# Patient Record
Sex: Male | Born: 1955 | Race: White | Hispanic: No | Marital: Married | Smoking: Never smoker
Health system: Southern US, Community
[De-identification: ages and names within clinical notes are randomized; demographics above are authoritative.]

## PROBLEM LIST (undated history)

## (undated) DIAGNOSIS — E291 Testicular hypofunction: Secondary | ICD-10-CM

## (undated) DIAGNOSIS — I639 Cerebral infarction, unspecified: Secondary | ICD-10-CM

## (undated) DIAGNOSIS — M545 Low back pain, unspecified: Secondary | ICD-10-CM

## (undated) DIAGNOSIS — E1165 Type 2 diabetes mellitus with hyperglycemia: Secondary | ICD-10-CM

## (undated) DIAGNOSIS — I10 Essential (primary) hypertension: Secondary | ICD-10-CM

## (undated) DIAGNOSIS — N529 Male erectile dysfunction, unspecified: Secondary | ICD-10-CM

## (undated) DIAGNOSIS — I4891 Unspecified atrial fibrillation: Secondary | ICD-10-CM

## (undated) DIAGNOSIS — E782 Mixed hyperlipidemia: Secondary | ICD-10-CM

## (undated) DIAGNOSIS — Z8719 Personal history of other diseases of the digestive system: Secondary | ICD-10-CM

## (undated) DIAGNOSIS — R001 Bradycardia, unspecified: Secondary | ICD-10-CM

## (undated) DIAGNOSIS — I459 Conduction disorder, unspecified: Secondary | ICD-10-CM

## (undated) DIAGNOSIS — I441 Atrioventricular block, second degree: Secondary | ICD-10-CM

## (undated) HISTORY — DX: Male erectile dysfunction, unspecified: N52.9

## (undated) HISTORY — PX: BACK SURGERY: SHX140

## (undated) HISTORY — DX: Atrioventricular block, second degree: I44.1

## (undated) HISTORY — PX: OTHER SURGICAL HISTORY: SHX169

## (undated) HISTORY — DX: Personal history of other diseases of the digestive system: Z87.19

## (undated) HISTORY — DX: Cerebral infarction, unspecified: I63.9

## (undated) HISTORY — DX: Type 2 diabetes mellitus with hyperglycemia: E11.65

## (undated) HISTORY — DX: Essential (primary) hypertension: I10

## (undated) HISTORY — DX: Unspecified atrial fibrillation: I48.91

## (undated) HISTORY — DX: Low back pain, unspecified: M54.50

## (undated) HISTORY — DX: Testicular hypofunction: E29.1

## (undated) HISTORY — DX: Mixed hyperlipidemia: E78.2

## (undated) HISTORY — DX: Bradycardia, unspecified: R00.1

---

## 2017-04-09 DIAGNOSIS — I10 Essential (primary) hypertension: Secondary | ICD-10-CM | POA: Insufficient documentation

## 2017-06-25 DIAGNOSIS — E1165 Type 2 diabetes mellitus with hyperglycemia: Secondary | ICD-10-CM | POA: Insufficient documentation

## 2017-06-25 DIAGNOSIS — M545 Low back pain, unspecified: Secondary | ICD-10-CM | POA: Insufficient documentation

## 2017-06-25 DIAGNOSIS — E291 Testicular hypofunction: Secondary | ICD-10-CM | POA: Insufficient documentation

## 2017-06-25 DIAGNOSIS — N529 Male erectile dysfunction, unspecified: Secondary | ICD-10-CM | POA: Insufficient documentation

## 2017-06-25 DIAGNOSIS — E782 Mixed hyperlipidemia: Secondary | ICD-10-CM | POA: Insufficient documentation

## 2017-06-25 DIAGNOSIS — Z8719 Personal history of other diseases of the digestive system: Secondary | ICD-10-CM | POA: Insufficient documentation

## 2018-07-02 DIAGNOSIS — R001 Bradycardia, unspecified: Secondary | ICD-10-CM | POA: Insufficient documentation

## 2018-07-02 DIAGNOSIS — I441 Atrioventricular block, second degree: Secondary | ICD-10-CM | POA: Insufficient documentation

## 2018-07-02 DIAGNOSIS — Z0000 Encounter for general adult medical examination without abnormal findings: Secondary | ICD-10-CM | POA: Insufficient documentation

## 2020-04-17 DIAGNOSIS — I10 Essential (primary) hypertension: Secondary | ICD-10-CM

## 2020-04-22 DIAGNOSIS — I480 Paroxysmal atrial fibrillation: Secondary | ICD-10-CM | POA: Insufficient documentation

## 2020-04-22 DIAGNOSIS — I639 Cerebral infarction, unspecified: Secondary | ICD-10-CM | POA: Insufficient documentation

## 2020-04-22 DIAGNOSIS — Z09 Encounter for follow-up examination after completed treatment for conditions other than malignant neoplasm: Secondary | ICD-10-CM | POA: Insufficient documentation

## 2020-05-24 ENCOUNTER — Other Ambulatory Visit: Payer: Self-pay

## 2020-05-26 ENCOUNTER — Observation Stay (HOSPITAL_COMMUNITY)
Admission: EM | Admit: 2020-05-26 | Discharge: 2020-05-27 | Disposition: A | Discharge status: Home / Self Care | Payer: No Typology Code available for payment source | Attending: Internal Medicine | Admitting: Internal Medicine

## 2020-05-26 ENCOUNTER — Emergency Department (HOSPITAL_COMMUNITY): Payer: No Typology Code available for payment source

## 2020-05-26 ENCOUNTER — Other Ambulatory Visit: Payer: Self-pay

## 2020-05-26 ENCOUNTER — Encounter: Payer: Self-pay | Admitting: Cardiology

## 2020-05-26 ENCOUNTER — Encounter (HOSPITAL_COMMUNITY): Payer: Self-pay

## 2020-05-26 ENCOUNTER — Ambulatory Visit (HOSPITAL_COMMUNITY)
Admission: EM | Disposition: A | Discharge status: Home / Self Care | Payer: Self-pay | Source: Home / Self Care | Attending: Emergency Medicine

## 2020-05-26 ENCOUNTER — Ambulatory Visit (INDEPENDENT_AMBULATORY_CARE_PROVIDER_SITE_OTHER): Payer: PRIVATE HEALTH INSURANCE | Admitting: Cardiology

## 2020-05-26 VITALS — BP 140/60 | HR 52 | Ht 69.0 in | Wt 166.0 lb

## 2020-05-26 DIAGNOSIS — Z09 Encounter for follow-up examination after completed treatment for conditions other than malignant neoplasm: Secondary | ICD-10-CM | POA: Diagnosis not present

## 2020-05-26 DIAGNOSIS — Z79899 Other long term (current) drug therapy: Secondary | ICD-10-CM | POA: Insufficient documentation

## 2020-05-26 DIAGNOSIS — Z7901 Long term (current) use of anticoagulants: Secondary | ICD-10-CM | POA: Diagnosis not present

## 2020-05-26 DIAGNOSIS — I10 Essential (primary) hypertension: Secondary | ICD-10-CM | POA: Diagnosis not present

## 2020-05-26 DIAGNOSIS — E119 Type 2 diabetes mellitus without complications: Secondary | ICD-10-CM | POA: Insufficient documentation

## 2020-05-26 DIAGNOSIS — I6930 Unspecified sequelae of cerebral infarction: Secondary | ICD-10-CM | POA: Insufficient documentation

## 2020-05-26 DIAGNOSIS — I480 Paroxysmal atrial fibrillation: Secondary | ICD-10-CM | POA: Diagnosis not present

## 2020-05-26 DIAGNOSIS — I442 Atrioventricular block, complete: Secondary | ICD-10-CM

## 2020-05-26 DIAGNOSIS — Z20822 Contact with and (suspected) exposure to covid-19: Secondary | ICD-10-CM | POA: Insufficient documentation

## 2020-05-26 DIAGNOSIS — Z950 Presence of cardiac pacemaker: Secondary | ICD-10-CM

## 2020-05-26 DIAGNOSIS — Z7984 Long term (current) use of oral hypoglycemic drugs: Secondary | ICD-10-CM | POA: Diagnosis not present

## 2020-05-26 DIAGNOSIS — Z959 Presence of cardiac and vascular implant and graft, unspecified: Secondary | ICD-10-CM

## 2020-05-26 DIAGNOSIS — R001 Bradycardia, unspecified: Secondary | ICD-10-CM | POA: Diagnosis present

## 2020-05-26 HISTORY — DX: Presence of cardiac pacemaker: Z95.0

## 2020-05-26 HISTORY — DX: Conduction disorder, unspecified: I45.9

## 2020-05-26 HISTORY — PX: PACEMAKER IMPLANT: EP1218

## 2020-05-26 LAB — CBC WITH DIFFERENTIAL/PLATELET
Abs Immature Granulocytes: 0.01 10*3/uL (ref 0.00–0.07)
Basophils Absolute: 0 10*3/uL (ref 0.0–0.1)
Basophils Relative: 0 %
Eosinophils Absolute: 0.1 10*3/uL (ref 0.0–0.5)
Eosinophils Relative: 1 %
HCT: 53.1 % — ABNORMAL HIGH (ref 39.0–52.0)
Hemoglobin: 17.1 g/dL — ABNORMAL HIGH (ref 13.0–17.0)
Immature Granulocytes: 0 %
Lymphocytes Relative: 15 %
Lymphs Abs: 1 10*3/uL (ref 0.7–4.0)
MCH: 30.2 pg (ref 26.0–34.0)
MCHC: 32.2 g/dL (ref 30.0–36.0)
MCV: 93.8 fL (ref 80.0–100.0)
Monocytes Absolute: 0.3 10*3/uL (ref 0.1–1.0)
Monocytes Relative: 5 %
Neutro Abs: 4.9 10*3/uL (ref 1.7–7.7)
Neutrophils Relative %: 79 %
Platelets: 148 10*3/uL — ABNORMAL LOW (ref 150–400)
RBC: 5.66 MIL/uL (ref 4.22–5.81)
RDW: 11.9 % (ref 11.5–15.5)
WBC: 6.3 10*3/uL (ref 4.0–10.5)
nRBC: 0 % (ref 0.0–0.2)

## 2020-05-26 LAB — BASIC METABOLIC PANEL
Anion gap: 11 (ref 5–15)
BUN: 41 mg/dL — ABNORMAL HIGH (ref 8–23)
CO2: 19 mmol/L — ABNORMAL LOW (ref 22–32)
Calcium: 9.5 mg/dL (ref 8.9–10.3)
Chloride: 108 mmol/L (ref 98–111)
Creatinine, Ser: 1.93 mg/dL — ABNORMAL HIGH (ref 0.61–1.24)
GFR calc non Af Amer: 36 mL/min — ABNORMAL LOW (ref >60)
Glucose, Bld: 223 mg/dL — ABNORMAL HIGH (ref 70–99)
Potassium: 4.5 mmol/L (ref 3.5–5.1)
Sodium: 138 mmol/L (ref 135–145)

## 2020-05-26 LAB — RESPIRATORY PANEL BY RT PCR (FLU A&B, COVID)
Influenza A by PCR: NEGATIVE
Influenza B by PCR: NEGATIVE
SARS Coronavirus 2 by RT PCR: NEGATIVE

## 2020-05-26 LAB — MAGNESIUM: Magnesium: 2.3 mg/dL (ref 1.7–2.4)

## 2020-05-26 LAB — GLUCOSE, CAPILLARY
Glucose-Capillary: 192 mg/dL — ABNORMAL HIGH (ref 70–99)
Glucose-Capillary: 181 mg/dL — ABNORMAL HIGH (ref 70–99)

## 2020-05-26 LAB — TSH: TSH: 1.791 u[IU]/mL (ref 0.350–4.500)

## 2020-05-26 SURGERY — PACEMAKER IMPLANT

## 2020-05-26 MED ORDER — SODIUM CHLORIDE 0.9 % IV SOLN: 250.0000 mL | INTRAVENOUS | Status: DC | PRN

## 2020-05-26 MED ORDER — HEPARIN (PORCINE) IN NACL 1000-0.9 UT/500ML-% IV SOLN
INTRAVENOUS | Status: AC
  Filled 2020-05-26: qty 500

## 2020-05-26 MED ORDER — LIDOCAINE HCL (PF) 1 % IJ SOLN
INTRAMUSCULAR | Status: DC | PRN
  Administered 2020-05-26: 60 mL

## 2020-05-26 MED ORDER — LINAGLIPTIN 5 MG PO TABS
5.0000 mg | ORAL_TABLET | Freq: Every day | ORAL | Status: DC
  Administered 2020-05-27: 5 mg via ORAL
  Filled 2020-05-26: qty 1

## 2020-05-26 MED ORDER — SODIUM CHLORIDE 0.9 % IV SOLN: INTRAVENOUS | Status: DC

## 2020-05-26 MED ORDER — LISINOPRIL-HYDROCHLOROTHIAZIDE 20-12.5 MG PO TABS: 1.0000 | ORAL_TABLET | Freq: Every day | ORAL | Status: DC

## 2020-05-26 MED ORDER — ACETAMINOPHEN 325 MG PO TABS: 325.0000 mg | ORAL_TABLET | ORAL | Status: DC | PRN

## 2020-05-26 MED ORDER — IOHEXOL 350 MG/ML SOLN
INTRAVENOUS | Status: DC | PRN
  Administered 2020-05-26: 15 mL

## 2020-05-26 MED ORDER — HYDROXYZINE HCL 25 MG PO TABS: 25.0000 mg | ORAL_TABLET | Freq: Three times a day (TID) | ORAL | Status: DC | PRN

## 2020-05-26 MED ORDER — HYDROCHLOROTHIAZIDE 12.5 MG PO CAPS
12.5000 mg | ORAL_CAPSULE | Freq: Every day | ORAL | Status: DC
  Administered 2020-05-27: 12.5 mg via ORAL
  Filled 2020-05-26: qty 1

## 2020-05-26 MED ORDER — SODIUM CHLORIDE 0.9% FLUSH
3.0000 mL | Freq: Two times a day (BID) | INTRAVENOUS | Status: DC
  Administered 2020-05-26 – 2020-05-27 (×2): 3 mL via INTRAVENOUS

## 2020-05-26 MED ORDER — CEFAZOLIN SODIUM-DEXTROSE 2-4 GM/100ML-% IV SOLN
INTRAVENOUS | Status: AC
  Filled 2020-05-26: qty 100

## 2020-05-26 MED ORDER — AMLODIPINE BESYLATE 10 MG PO TABS
10.0000 mg | ORAL_TABLET | Freq: Every day | ORAL | Status: DC
  Administered 2020-05-27: 10 mg via ORAL
  Filled 2020-05-26: qty 1

## 2020-05-26 MED ORDER — LISINOPRIL 20 MG PO TABS
20.0000 mg | ORAL_TABLET | Freq: Every day | ORAL | Status: DC
  Filled 2020-05-26: qty 1

## 2020-05-26 MED ORDER — HYDRALAZINE HCL 10 MG PO TABS
10.0000 mg | ORAL_TABLET | Freq: Three times a day (TID) | ORAL | Status: DC
  Administered 2020-05-26 – 2020-05-27 (×2): 10 mg via ORAL
  Filled 2020-05-26 (×2): qty 1

## 2020-05-26 MED ORDER — CHLORHEXIDINE GLUCONATE 4 % EX LIQD
60.0000 mL | Freq: Once | CUTANEOUS | Status: DC
  Filled 2020-05-26: qty 60

## 2020-05-26 MED ORDER — HYDROCODONE-ACETAMINOPHEN 5-325 MG PO TABS
1.0000 | ORAL_TABLET | ORAL | Status: DC | PRN
  Administered 2020-05-26 – 2020-05-27 (×2): 1 via ORAL
  Filled 2020-05-26 (×2): qty 1

## 2020-05-26 MED ORDER — SODIUM CHLORIDE 0.9 % IV SOLN
INTRAVENOUS | Status: AC
  Filled 2020-05-26: qty 2

## 2020-05-26 MED ORDER — CEFAZOLIN SODIUM-DEXTROSE 2-4 GM/100ML-% IV SOLN
2.0000 g | INTRAVENOUS | Status: AC
  Administered 2020-05-26: 2 g via INTRAVENOUS

## 2020-05-26 MED ORDER — ONDANSETRON HCL 4 MG/2ML IJ SOLN: 4.0000 mg | Freq: Four times a day (QID) | INTRAMUSCULAR | Status: DC | PRN

## 2020-05-26 MED ORDER — SODIUM CHLORIDE 0.9% FLUSH: 3.0000 mL | INTRAVENOUS | Status: DC | PRN

## 2020-05-26 MED ORDER — HEPARIN (PORCINE) IN NACL 1000-0.9 UT/500ML-% IV SOLN
INTRAVENOUS | Status: DC | PRN
  Administered 2020-05-26: 500 mL

## 2020-05-26 MED ORDER — LIDOCAINE HCL 1 % IJ SOLN
INTRAMUSCULAR | Status: AC
  Filled 2020-05-26: qty 60

## 2020-05-26 MED ORDER — EMPAGLIFLOZIN 10 MG PO TABS
10.0000 mg | ORAL_TABLET | Freq: Every day | ORAL | Status: DC
  Administered 2020-05-27: 10 mg via ORAL
  Filled 2020-05-26: qty 1

## 2020-05-26 MED ORDER — HYPROMELLOSE (GONIOSCOPIC) 2.5 % OP SOLN
1.0000 [drp] | OPHTHALMIC | Status: DC | PRN
  Filled 2020-05-26: qty 15

## 2020-05-26 MED ORDER — ATORVASTATIN CALCIUM 40 MG PO TABS
40.0000 mg | ORAL_TABLET | Freq: Every day | ORAL | Status: DC
  Administered 2020-05-27: 40 mg via ORAL
  Filled 2020-05-26: qty 1

## 2020-05-26 MED ORDER — CEFAZOLIN SODIUM-DEXTROSE 1-4 GM/50ML-% IV SOLN
1.0000 g | Freq: Three times a day (TID) | INTRAVENOUS | Status: DC
  Administered 2020-05-26 – 2020-05-27 (×2): 1 g via INTRAVENOUS
  Filled 2020-05-26 (×3): qty 50

## 2020-05-26 MED ORDER — SODIUM CHLORIDE 0.9 % IV SOLN
80.0000 mg | INTRAVENOUS | Status: AC
  Administered 2020-05-26: 80 mg
  Filled 2020-05-26: qty 2

## 2020-05-26 SURGICAL SUPPLY — 12 items

## 2020-05-26 NOTE — ED Triage Notes (Signed)
Pt arrived via [facility] from [facility] for complete heart block per cardiologist. Pt is A&Ox4. Pt has 3rd degree block on monitor.

## 2020-05-26 NOTE — H&P (Addendum)
ELECTROPHYSIOLOGY CONSULT NOTE    Patient ID: Gabriel Velazquez MRN: [id number], DOB/AGE: 1955/11/14 65 y.o.  Admit date: 05/26/2020 Date of Consult: 05/26/2020  Primary Physician: Hadley Pen, MD Primary Cardiologist: No primary care provider on file.  Electrophysiologist: New  Reason for Admission: CHB  Patient Profile: Gabriel Velazquez is a 64 y.o. male with a history of paroxysmal atrial fibrillation, HTN, DM2, ETOH use, and recent CVA who is being seen today for the evaluation of CHB at the request of Dr. Lowella Curb (Sent via EMS from [place]).  HPI:  Gabriel Velazquez is a 64 y.o. male with medical history of above.   He was noted to have new AF when seen for his stroke last month. Has been on Eliquis. He presented to Dr. Vanetta Shawl office today and was found to be in CHB with junctional escape in the 50s, though asymptomatic.  Pt denied chest pain, syncope, lightheadedness or dizziness. No peripheral edema.  Pt sent to [facility] via EMS for PPM consideration.  He is feeling Ok currently. He report he had his stroke on 04/24/2020 and that he has no residual deficit. Atrial fibrillation was newly diagnosed that admission. He reports previous history of heart catheterizations for non specific chest pain that were negative. He denies syncope, orthopnea, edema, N/V/D, or recent illness. He does have mild DOE over the past several weeks.   COVID negative. K WNL. Cr 1.9  Past Medical History:  Diagnosis Date  . Atrial fibrillation (HCC)   . Bradycardia   . Cerebrovascular accident (CVA) (HCC)   . Combined hyperlipidemia   . History of diverticulosis   . Hypogonadism in male   . Impotence of organic origin   . Low back pain   . Mixed dyslipidemia   . Mobitz type 1 second degree atrioventricular block   . Type 2 diabetes mellitus with hyperglycemia, without long-term current use of insulin (HCC)   . Uncontrolled hypertension      Surgical History:  Past Surgical History:  Procedure  Laterality Date  . BACK SURGERY    . colonscopy with polypectomy       (Not in a hospital admission)   Inpatient Medications:   Allergies: No Known Allergies  Social History   Socioeconomic History  . Marital status: Married    Spouse name: Not on file  . Number of children: Not on file  . Years of education: Not on file  . Highest education level: Not on file  Occupational History  . Not on file  Tobacco Use  . Smoking status: Never Smoker  . Smokeless tobacco: Never Used  Substance and Sexual Activity  . Alcohol use: Never  . Drug use: Never  . Sexual activity: Not on file  Other Topics Concern  . Not on file  Social History Narrative  . Not on file   Social Determinants of Health   Financial Resource Strain:   . Difficulty of Paying Living Expenses: Not on file  Food Insecurity:   . Worried About Programme researcher, broadcasting/film/video in the Last Year: Not on file  . Ran Out of Food in the Last Year: Not on file  Transportation Needs:   . Lack of Transportation (Medical): Not on file  . Lack of Transportation (Non-Medical): Not on file  Physical Activity:   . Days of Exercise per Week: Not on file  . Minutes of Exercise per Session: Not on file  Stress:   . Feeling of Stress : Not on file  Social  Connections:   . Frequency of Communication with Friends and Family: Not on file  . Frequency of Social Gatherings with Friends and Family: Not on file  . Attends Religious Services: Not on file  . Active Member of Clubs or Organizations: Not on file  . Attends Banker Meetings: Not on file  . Marital Status: Not on file  Intimate Partner Violence:   . Fear of Current or Ex-Partner: Not on file  . Emotionally Abused: Not on file  . Physically Abused: Not on file  . Sexually Abused: Not on file     Family History  Problem Relation Age of Onset  . Throat cancer Mother   . Heart disease Father   . Heart disease Brother      Review of Systems: All other systems  reviewed and are otherwise negative except as noted above.  Physical Exam: Vitals:   05/26/20 1211 05/26/20 1230 05/26/20 1245 05/26/20 1300  BP:  (!) 162/64 139/69 (!) 148/62  Pulse:  (!) 40 (!) 38 (!) 40  Resp:  20 (!) 21 (!) 21  Temp:      TempSrc:      SpO2:  100% 100% 100%  Weight: 75.3 kg     Height: 5\' 9"  (1.753 m)       GEN- The patient is well appearing, alert and oriented x 3 today.   HEENT: normocephalic, atraumatic; sclera clear, conjunctiva pink; hearing intact; oropharynx clear; neck supple Lungs- Clear to ausculation bilaterally, normal work of breathing.  No wheezes, rales, rhonchi Heart- Regular rate and rhythm, no murmurs, rubs or gallops GI- soft, non-tender, non-distended, bowel sounds present Extremities- no clubbing, cyanosis, or edema; DP/PT/radial pulses 2+ bilaterally MS- no significant deformity or atrophy Skin- warm and dry, no rash or lesion Psych- euthymic mood, full affect Neuro- strength and sensation are intact  Labs:  No results found for: WBC, HGB, HCT, MCV, PLT No results for input(s): NA, K, CL, CO2, BUN, CREATININE, CALCIUM, PROT, BILITOT, ALKPHOS, ALT, AST, GLUCOSE in the last 168 hours.  Invalid input(s): LABALBU    Radiology/Studies: DG Chest Portable 1 View  Result Date: 05/26/2020 CLINICAL DATA:  Bradycardia EXAM: PORTABLE CHEST 1 VIEW COMPARISON:  April 17, 2020 FINDINGS: Lungs are clear. The heart size is upper normal with pulmonary vascularity within normal limits. No adenopathy. No pneumothorax. No bone lesions. IMPRESSION: Lungs clear.  Heart upper normal in size. Electronically Signed   By: April 19, 2020 III M.D.   On: 05/26/2020 12:59    EKG: CHB with junctional escape at 38 bpm, narrow QRS (personally reviewed)  TELEMETRY: Intermittent CHB and 2:1 AV block (personally reviewed)  Assessment/Plan: 1.  Complete heart Block Pt is not on AV nodal blocking agents.  Electrolytes and TSH WNL. COVID negative.  Recent Echo  during CVA work up showed normal EF per notes from [place].  He has demonstrated CHB without reversible cause. Explained risks, benefits, and alternatives to PPM implantation, including but not limited to bleeding, infection, pneumothorax, pericardial effusion, lead dislodgement, heart attack, stroke, or death.  Pt verbalized understanding and agrees to proceed.   2. CVA 04/2020 Doing well overall. Denies deficit  3. HTN Can add BB s/p PPM.  4. DM2 Continue current medications  5. PAF CHA2DS2VASC of at least 4.   On Eliquis. Will resume as soon as safe from a PPM standpoint in the setting of recent stroke.   6. CKD III Unclear baseline. Cr 1.9 on admission  Dr. 05/2020 has  seen the patient and plans for PPM implantation this afternoon.   For questions or updates, please contact [facility] Please consult [email or web address] for contact info under Cardiology/STEMI.  Signed, Graciella Freer, PA-C  05/26/2020 1:23 PM   I have seen, examined the patient, and reviewed the above assessment and plan.  Changes to above are made where necessary.  On exam, bradycardic rhythm.  He appears to have complete heart block.  He reports progressive SOB and decreased exercise tolerance for several weeks.  The patient has symptomatic bradycardia without reversible cause.  I would therefore recommend pacemaker implantation at this time.  Risks, benefits, alternatives to pacemaker implantation were discussed in detail with the patient today. The patient understands that the risks include but are not limited to bleeding, infection, pneumothorax, perforation, tamponade, vascular damage, renal failure, MI, stroke, death,  and lead dislodgement and wishes to proceed    Co Sign: Hillis Range, MD 05/26/2020 3:18 PM

## 2020-05-26 NOTE — ED Provider Notes (Signed)
[facility] EMERGENCY DEPARTMENT Provider Note   CSN: [id number] Arrival date & time: 05/26/20  1207     History Chief Complaint  Patient presents with  . Bradycardia    Gabriel Velazquez is a 64 y.o. male.  HPI      Gabriel Velazquez is a 64 y.o. male, with a history of A. fib, CVA, DM type II, HTN, dyslipidemia, Mobitz type I, presenting to the ED sent by cardiology office for bradycardia and concern for complete heart block.  He was seen by cardiology, Dr. Bing Matter, this morning as routine follow-up for his CVA hospitalization that occurred about a month ago. Incidentally noted complete heart block on EKG. Patient sent by EMS to our ED.  Patient denies fever/chills, dizziness, chest pain, shortness of breath, N/V/D, syncope, or any other complaints.   Past Medical History:  Diagnosis Date  . Atrial fibrillation (HCC)   . Bradycardia   . Cerebrovascular accident (CVA) (HCC)   . Combined hyperlipidemia   . History of diverticulosis   . Hypogonadism in male   . Impotence of organic origin   . Low back pain   . Mixed dyslipidemia   . Mobitz type 1 second degree atrioventricular block   . Type 2 diabetes mellitus with hyperglycemia, without long-term current use of insulin (HCC)   . Uncontrolled hypertension     Patient Active Problem List   Diagnosis Date Noted  . Heart block AV complete (HCC) 05/26/2020  . 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  . Paroxysmal atrial fibrillation (HCC) 04/22/2020  . Cerebrovascular accident (CVA) (HCC) 04/22/2020  . Hospital discharge follow-up 04/22/2020  . Bradycardia 07/02/2018  . Mobitz type 1 second degree atrioventricular block 07/02/2018  . Annual physical exam 07/02/2018  . Combined hyperlipidemia 06/25/2017  . History of diverticulosis 06/25/2017  . Hypogonadism in male 06/25/2017  . ED (erectile dysfunction) of organic origin 06/25/2017  . Low back pain 06/25/2017  . Mixed dyslipidemia  06/25/2017  . Type 2 diabetes mellitus with hyperglycemia, without long-term current use of insulin (HCC) 06/25/2017  . Essential hypertension 04/09/2017    Past Surgical History:  Procedure Laterality Date  . BACK SURGERY    . colonscopy with polypectomy         Family History  Problem Relation Age of Onset  . Throat cancer Mother   . Heart disease Father   . Heart disease Brother     Social History   Tobacco Use  . Smoking status: Never Smoker  . Smokeless tobacco: Never Used  Substance Use Topics  . Alcohol use: Never  . Drug use: Never    Home Medications Prior to Admission medications   Medication Sig Start Date End Date Taking? Authorizing Provider  amLODipine (NORVASC) 10 MG tablet Take 10 mg by mouth daily. 11/16/19  Yes [provider]  apixaban (ELIQUIS) 5 MG TABS tablet Take 5 mg by mouth 2 (two) times daily. 05/24/20  Yes [provider]  atorvastatin (LIPITOR) 40 MG tablet Take 40 mg by mouth daily.  07/02/18  Yes [provider]  empagliflozin (JARDIANCE) 10 MG TABS tablet Take 10 mg by mouth daily. 05/24/20  Yes [provider]  hydrALAZINE (APRESOLINE) 10 MG tablet Take 10 mg by mouth 3 (three) times daily. 05/11/20  Yes [provider]  hydroxypropyl methylcellulose / hypromellose (ISOPTO TEARS / GONIOVISC) 2.5 % ophthalmic solution Place 1 drop into both eyes as needed for dry eyes.   Yes [provider]  hydrOXYzine (ATARAX/VISTARIL) 25 MG tablet Take 25 mg by mouth every 8 (eight) hours as needed for anxiety. 05/11/20  Yes [provider]  lisinopril-hydrochlorothiazide (ZESTORETIC) 20-12.5 MG tablet Take 1 tablet by mouth daily.   Yes [provider]  sitaGLIPtin (JANUVIA) 100 MG tablet Take 100 mg by mouth daily. 04/22/20  Yes [provider]    Allergies    Patient has no known allergies.  Review of Systems   Review of Systems  Constitutional: Negative for chills,  diaphoresis and fever.  Respiratory: Negative for shortness of breath.   Cardiovascular: Negative for chest pain.       Bradycardia  Gastrointestinal: Negative for abdominal pain, diarrhea, nausea and vomiting.  Neurological: Negative for dizziness, syncope, weakness and light-headedness.  All other systems reviewed and are negative.   Physical Exam Updated Vital Signs BP (!) 179/66   Pulse (!) 43   Temp 97.9 F (36.6 C) (Oral)   Resp 20   Ht 5\' 9"  (1.753 m)   Wt 75.3 kg   SpO2 99%   BMI 24.51 kg/m   Physical Exam Vitals and nursing note reviewed.  Constitutional:      General: He is not in acute distress.    Appearance: He is well-developed. He is not diaphoretic.  HENT:     Head: Normocephalic and atraumatic.     Mouth/Throat:     Mouth: Mucous membranes are moist.     Pharynx: Oropharynx is clear.  Eyes:     Conjunctiva/sclera: Conjunctivae normal.  Cardiovascular:     Rate and Rhythm: Regular rhythm. Bradycardia present.     Pulses: Normal pulses.          Radial pulses are 2+ on the right side and 2+ on the left side.       Posterior tibial pulses are 2+ on the right side and 2+ on the left side.     Heart sounds: Normal heart sounds.     Comments: Tactile temperature in the extremities appropriate and equal bilaterally. Pulmonary:     Effort: Pulmonary effort is normal. No respiratory distress.     Breath sounds: Normal breath sounds.  Abdominal:     Tenderness: There is no guarding.  Musculoskeletal:     Cervical back: Neck supple.     Right lower leg: No edema.     Left lower leg: No edema.  Skin:    General: Skin is warm and dry.  Neurological:     Mental Status: He is alert and oriented to person, place, and time.  Psychiatric:        Mood and Affect: Mood and affect normal.        Speech: Speech normal.        Behavior: Behavior normal.     ED Results / Procedures / Treatments   Labs (all labs ordered are listed, but only abnormal results are  displayed) Labs Reviewed  BASIC METABOLIC PANEL - Abnormal; Notable for the following components:      Result Value   CO2 19 (*)    Glucose, Bld 223 (*)    BUN 41 (*)    Creatinine, Ser 1.93 (*)    GFR calc non Af Amer 36 (*)    All other components within normal limits  CBC WITH DIFFERENTIAL/PLATELET - Abnormal; Notable for the following components:   Hemoglobin 17.1 (*)    HCT 53.1 (*)    Platelets 148 (*)    All other components within normal limits  RESPIRATORY PANEL BY RT PCR (FLU A&B, COVID)  SURGICAL PCR SCREEN  MAGNESIUM  TSH    EKG EKG Interpretation  Date/Time:  Thursday May 26 2020 12:07:48 EDT Ventricular Rate:  38 PR Interval:    QRS Duration: 107 QT Interval:  425 QTC Calculation: 338 R Axis:   12 Text Interpretation: Sinus bradycardia Atrial premature complex LVH with secondary repolarization abnormality Confirmed by Marianna Fuss ([id number]) on 05/26/2020 12:14:13 PM   Radiology DG Chest Portable 1 View  Result Date: 05/26/2020 CLINICAL DATA:  Bradycardia EXAM: PORTABLE CHEST 1 VIEW COMPARISON:  April 17, 2020 FINDINGS: Lungs are clear. The heart size is upper normal with pulmonary vascularity within normal limits. No adenopathy. No pneumothorax. No bone lesions. IMPRESSION: Lungs clear.  Heart upper normal in size. Electronically Signed   By: Bretta Bang III M.D.   On: 05/26/2020 12:59    Procedures .Critical Care Performed by: Anselm Pancoast, PA-C Authorized by: Anselm Pancoast, PA-C   Critical care provider statement:    Critical care time (minutes):  35   Critical care time was exclusive of:  Separately billable procedures and treating other patients   Critical care was necessary to treat or prevent imminent or life-threatening deterioration of the following conditions:  Cardiac failure (Third-degree complete heart block)   Critical care was time spent personally by me on the following activities:  Ordering and performing treatments and  interventions, ordering and review of laboratory studies, ordering and review of radiographic studies, re-evaluation of patient's condition, obtaining history from patient or surrogate, examination of patient, discussions with consultants, development of treatment plan with patient or surrogate, review of old charts and pulse oximetry   I assumed direction of critical care for this patient from another provider in my specialty: no     (including critical care time)  Medications Ordered in ED Medications  0.9 %  sodium chloride infusion (has no administration in time range)  gentamicin (GARAMYCIN) 80 mg in sodium chloride 0.9 % 500 mL irrigation (has no administration in time range)  chlorhexidine (HIBICLENS) 4 % liquid 4 application (has no administration in time range)  chlorhexidine (HIBICLENS) 4 % liquid 4 application (has no administration in time range)  0.9 %  sodium chloride infusion (has no administration in time range)  ceFAZolin (ANCEF) IVPB 2g/100 mL premix (has no administration in time range)    ED Course  I have reviewed the triage vital signs and the nursing notes.  Pertinent labs & imaging results that were available during my care of the patient were reviewed by me and considered in my medical decision making (see chart for details).  Clinical Course as of May 26 1501  Thu May 26, 2020  1321 Spoke with Rosann Auerbach, cardiology. They will send down a team member.    [SJ]    Clinical Course User Index [SJ] Cyle Kenyon, Lorella Nimrod   MDM Rules/Calculators/A&P                          Patient presents from cardiology office with complete (third-degree) heart block EKG from their office.  No hypotension.  Normal mental status. I personally reviewed and interpreted the patient's labs and imaging studies. Patient has elevation in creatinine and BUN without previous values more recent than 2019 with which to compare.  Both normal in 2019. No acute abnormalities noted on chest x-ray,  TSH, magnesium, or other electrolytes. Patient reevaluated in the ED multiple times with  no changes noted.  Patient evaluated by cardiology and permanent pacemaker was recommended.  Plan for cardiology to perform this procedure this afternoon.   Findings and plan of care discussed with Marianna Fuss, MD. Dr. Stevie Kern personally evaluated and examined this patient.   Vitals:   05/26/20 1400 05/26/20 1415 05/26/20 1430 05/26/20 1445  BP: (!) 177/62 (!) 169/67 (!) 164/70 (!) 152/95  Pulse: (!) 38 (!) 42 (!) 41 (!) 40  Resp: (!) 21 19 20 15   Temp:      TempSrc:      SpO2: 100% 100% 100% 100%  Weight:      Height:         Final Clinical Impression(s) / ED Diagnoses Final diagnoses:  Complete heart block [facility])    Rx / DC Orders ED Discharge Orders    None       [facility] 05/26/20 1507    07/26/20, MD 05/28/20 1646

## 2020-05-26 NOTE — Progress Notes (Signed)
Cardiology Consultation:    Date:  05/26/2020   ID:  Gabriel Velazquez, DOB 28-Dec-1955, MRN [id number]  PCP:  Hadley Pen, MD  Cardiologist:  Gypsy Balsam, MD   Referring MD: Hadley Pen, MD   Chief Complaint  Patient presents with  . Hospitalization Follow-up    History of Present Illness:    Gabriel Velazquez is a 64 y.o. male who is being seen today for the evaluation of past hospitalization at the request of Hadley Pen, MD.  Recently about a month ago he ended going to round of hospital.  The reason why he went there was the fact that he got some right facial drop as well as facial numbness.  He also has some difficulty speaking.  His past medical history significant for hypertension diabetes type 2 he used to be a regular alcohol drinker however stopped drinking after he was discharged from hospital.  While in the hospital quite extensive evaluation has been done.  His echocardiogram showed normal left ventricle ejection fraction, however during echocardiogram he was noted to have atrial fibrillation.  He was monitor in telemetry however I do not have any documentation of atrial fibrillation.  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  He came to our office today to be establish as a patient.  He is doing well he denies have any chest pain tightness squeezing pressure burning chest, no syncope no dizziness.  Surprisingly his EKG today showed complete heart block.  There is documentation in the hospital that he was having first-degree type I AV block while in the hospital.  However I do not see EKG confirming that.  However today he clearly does have complete heart block with complete dissociation luckily his escape mechanism is quite stable involve a junctional rhythm.  And again he is asymptomatic.  Past Medical History:  Diagnosis Date  . Atrial  fibrillation (HCC)   . Bradycardia   . Cerebrovascular accident (CVA) (HCC)   . Combined hyperlipidemia   . History of diverticulosis   . Hypogonadism in male   . Impotence of organic origin   . Low back pain   . Mixed dyslipidemia   . Mobitz type 1 second degree atrioventricular block   . Type 2 diabetes mellitus with hyperglycemia, without long-term current use of insulin (HCC)   . Uncontrolled hypertension     Past Surgical History:  Procedure Laterality Date  . BACK SURGERY    . colonscopy with polypectomy      Current Medications: Current Meds  Medication Sig  . amLODipine (NORVASC) 10 MG tablet Take 10 mg by mouth daily.  Marland Kitchen apixaban (ELIQUIS) 5 MG TABS tablet Take 5 mg by mouth 2 (two) times daily.  Marland Kitchen atorvastatin (LIPITOR) 20 MG tablet Take 20 mg by mouth daily.  . empagliflozin (JARDIANCE) 10 MG TABS tablet Take 10 mg by mouth daily.  . hydrALAZINE (APRESOLINE) 10 MG tablet Take 10 mg by mouth 3 (three) times daily.  . hydrOXYzine (ATARAX/VISTARIL) 25 MG tablet Take 25 mg by mouth every 8 (eight) hours as needed for anxiety.  . pantoprazole (PROTONIX) 40 MG tablet Take 40 mg by mouth daily.  . sitaGLIPtin (JANUVIA) 100 MG tablet Take 100 mg by mouth daily.     Allergies:   Patient has no known allergies.   Social History   Socioeconomic History  .  Marital status: Married    Spouse name: Not on file  . Number of children: Not on file  . Years of education: Not on file  . Highest education level: Not on file  Occupational History  . Not on file  Tobacco Use  . Smoking status: Never Smoker  . Smokeless tobacco: Never Used  Substance and Sexual Activity  . Alcohol use: Never  . Drug use: Never  . Sexual activity: Not on file  Other Topics Concern  . Not on file  Social History Narrative  . Not on file   Social Determinants of Health   Financial Resource Strain:   . Difficulty of Paying Living Expenses: Not on file  Food Insecurity:   . Worried About  Programme researcher, broadcasting/film/video in the Last Year: Not on file  . Ran Out of Food in the Last Year: Not on file  Transportation Needs:   . Lack of Transportation (Medical): Not on file  . Lack of Transportation (Non-Medical): Not on file  Physical Activity:   . Days of Exercise per Week: Not on file  . Minutes of Exercise per Session: Not on file  Stress:   . Feeling of Stress : Not on file  Social Connections:   . Frequency of Communication with Friends and Family: Not on file  . Frequency of Social Gatherings with Friends and Family: Not on file  . Attends Religious Services: Not on file  . Active Member of Clubs or Organizations: Not on file  . Attends Banker Meetings: Not on file  . Marital Status: Not on file     Family History: The patient's family history includes Heart disease in his brother and father; Throat cancer in his mother. ROS:   Please see the history of present illness.    All 14 point review of systems negative except as described per history of present illness.  EKGs/Labs/Other Studies Reviewed:    The following studies were reviewed today: Complete heart block with junctional escape rhythm with left ventricle hypertrophy.    Recent Labs: No results found for requested labs within last 8760 hours.  Recent Lipid Panel No results found for: CHOL, TRIG, HDL, CHOLHDL, VLDL, LDLCALC, LDLDIRECT  Physical Exam:    VS:  BP 140/60 (BP Location: Left Arm, Patient Position: Sitting, Cuff Size: Normal)   Pulse (!) 52   Ht 5\' 9"  (1.753 m)   Wt 166 lb (75.3 kg)   SpO2 97%   BMI 24.51 kg/m     Wt Readings from Last 3 Encounters:  05/26/20 166 lb (75.3 kg)     GEN:  Well nourished, well developed in no acute distress HEENT: Normal NECK: No JVD; No carotid bruits LYMPHATICS: No lymphadenopathy CARDIAC: RRR, no murmurs, no rubs, no gallops RESPIRATORY:  Clear to auscultation without rales, wheezing or rhonchi  ABDOMEN: Soft, non-tender,  non-distended MUSCULOSKELETAL:  No edema; No deformity  SKIN: Warm and dry NEUROLOGIC:  Alert and oriented x 3 PSYCHIATRIC:  Normal affect   ASSESSMENT:    1. Hospital discharge follow-up   2. Heart block AV complete (HCC)   3. Late effect of cerebrovascular accident (CVA)   4. Essential hypertension    PLAN:    In order of problems listed above:  1. Hospital discharge follow-up.  He was in the hospital because of CVA.  He will have almost complete resolution of his CVA symptoms which included speech impairment.  He still gets a little bit but overall doing  quite well.  He has been on Eliquis since he is being discharged from the hospital since he was noted to have episodes of atrial fibrillation while in the hospital. 2. Complete heart block is a surprising discovery gentleman appears to be completely asymptomatic however he does have to be paced.  I did review his medications there is nothing to suppress his AV node, we also call pharmacy to verify his medications.  There was a time that he was taking carvedilol however that medication has been withdrawn years ago because of bradycardia.  He is not taking any AV blocking agent.  I do not know what his diuretics. 3. Late effect of CVA doing well from that point review. 4. Essential hypertension: His blood pressure elevated today this is first visit in my office.  Obviously will take care of this. 5.  Diabetes mellitus: He is on Januvia as well as her Jardiance.  Will continue. 6.  Paroxysmal atrial fibrillation apparently noted while he was in the hospital.  He is being on Eliquis which we will hold for now with anticipation of pacemaker implantation.  Surprising discovery he does have complete heart block in spite of that he does not have any symptoms.  We had a long discussion with him as well as his wife and I recommended admission to the hospital for pacemaker implantation.  So far I do not see any reversible reason for his complete  heart block.  I did talk to Dr. Johney Frame regarding that.  He will be transferring the ambulance to [facility] for potential pacemaker plantation.   Medication Adjustments/Labs and Tests Ordered: Current medicines are reviewed at length with the patient today.  Concerns regarding medicines are outlined above.  No orders of the defined types were placed in this encounter.  No orders of the defined types were placed in this encounter.   Signed, Georgeanna Lea, MD, [facility]. 05/26/2020 11:09 AM    [facility]

## 2020-05-26 NOTE — ED Notes (Signed)
Placed pt on zoll monitor as precaution 

## 2020-05-27 ENCOUNTER — Other Ambulatory Visit: Payer: Self-pay

## 2020-05-27 ENCOUNTER — Encounter (HOSPITAL_COMMUNITY): Payer: Self-pay | Admitting: Internal Medicine

## 2020-05-27 ENCOUNTER — Observation Stay (HOSPITAL_COMMUNITY): Payer: No Typology Code available for payment source

## 2020-05-27 DIAGNOSIS — I442 Atrioventricular block, complete: Secondary | ICD-10-CM | POA: Diagnosis not present

## 2020-05-27 LAB — BASIC METABOLIC PANEL
Anion gap: 12 (ref 5–15)
BUN: 35 mg/dL — ABNORMAL HIGH (ref 8–23)
CO2: 17 mmol/L — ABNORMAL LOW (ref 22–32)
Calcium: 9.6 mg/dL (ref 8.9–10.3)
Chloride: 108 mmol/L (ref 98–111)
Creatinine, Ser: 1.74 mg/dL — ABNORMAL HIGH (ref 0.61–1.24)
GFR calc non Af Amer: 41 mL/min — ABNORMAL LOW (ref >60)
Glucose, Bld: 159 mg/dL — ABNORMAL HIGH (ref 70–99)
Potassium: 4 mmol/L (ref 3.5–5.1)
Sodium: 137 mmol/L (ref 135–145)

## 2020-05-27 LAB — GLUCOSE, CAPILLARY
Glucose-Capillary: 165 mg/dL — ABNORMAL HIGH (ref 70–99)
Glucose-Capillary: 228 mg/dL — ABNORMAL HIGH (ref 70–99)

## 2020-05-27 MED ORDER — ACETAMINOPHEN 325 MG PO TABS
325.0000 mg | ORAL_TABLET | ORAL | Status: DC | PRN
Start: 2020-05-27 — End: 2021-12-11

## 2020-05-27 MED ORDER — APIXABAN 5 MG PO TABS: 5.0000 mg | ORAL_TABLET | Freq: Two times a day (BID) | ORAL | Status: AC

## 2020-05-27 MED FILL — Lidocaine HCl Local Inj 1%: INTRAMUSCULAR | Qty: 60 | Status: AC

## 2020-05-27 NOTE — Progress Notes (Signed)
Inpatient Diabetes Program Recommendations  AACE/ADA: New Consensus Statement on Inpatient Glycemic Control (2015)  Target Ranges:  Prepandial:   less than 140 mg/dL      Peak postprandial:   less than 180 mg/dL (1-2 hours)      Critically ill patients:  140 - 180 mg/dL   Lab Results  Component Value Date   GLUCAP 165 (H) 05/27/2020    Review of Glycemic Control Results for Gabriel Velazquez, Gabriel Velazquez (MRN [id number]) as of 05/27/2020 10:13  Ref. Range 05/26/2020 21:36 05/27/2020 05:32  Glucose-Capillary Latest Ref Range: 70 - 99 mg/dL 885 (H) 027 (H)   Diabetes history: Type 2 DM Outpatient Diabetes medications: Jardiance 10 mg QD, Januvia 100 mg QD Current orders for Inpatient glycemic control: Jardiance 10 mg QD, Tradjenta 5 mg QD  Inpatient Diabetes Program Recommendations:    If to remain inpatient, Consider adding Novolog 0-9 units TID & HS.   Thanks, Lujean Rave, MSN, RNC-OB Diabetes Coordinator [phone] (8a-5p)

## 2020-05-27 NOTE — Plan of Care (Signed)
  Problem: Clinical Measurements: Goal: Will remain free from infection Outcome: Completed/Met   Problem: Activity: Goal: Risk for activity intolerance will decrease Outcome: Completed/Met   Problem: Nutrition: Goal: Adequate nutrition will be maintained Outcome: Completed/Met   Problem: Coping: Goal: Level of anxiety will decrease Outcome: Completed/Met   Problem: Elimination: Goal: Will not experience complications related to bowel motility Outcome: Completed/Met Goal: Will not experience complications related to urinary retention Outcome: Completed/Met   Problem: Safety: Goal: Ability to remain free from injury will improve Outcome: Completed/Met

## 2020-05-27 NOTE — Progress Notes (Signed)
   Progress Note   Subjective   Doing well today, the patient denies CP or SOB.  No new concerns  Inpatient Medications    Scheduled Meds: . amLODipine  10 mg Oral Daily  . atorvastatin  40 mg Oral Daily  . empagliflozin  10 mg Oral Daily  . hydrALAZINE  10 mg Oral TID  . lisinopril  20 mg Oral Daily   Or  . hydrochlorothiazide  12.5 mg Oral Daily  . linagliptin  5 mg Oral Daily  . sodium chloride flush  3 mL Intravenous Q12H   Continuous Infusions: . sodium chloride    .  ceFAZolin (ANCEF) IV 1 g (05/27/20 0518)   PRN Meds: sodium chloride, acetaminophen, HYDROcodone-acetaminophen, hydroxypropyl methylcellulose / hypromellose, hydrOXYzine, ondansetron (ZOFRAN) IV, sodium chloride flush   Vital Signs    Vitals:   05/26/20 2344 05/27/20 0400 05/27/20 0510 05/27/20 0800  BP: 136/81  135/82 (!) 151/84  Pulse: 69  63 75  Resp: 18  18 18   Temp: 98.2 F (36.8 C)  98.3 F (36.8 C) 98.1 F (36.7 C)  TempSrc: Oral  Oral Oral  SpO2: 96%  97% 99%  Weight:  72 kg    Height:        Intake/Output Summary (Last 24 hours) at 05/27/2020 0845 Last data filed at 05/27/2020 0827 Gross per 24 hour  Intake 797 ml  Output 1250 ml  Net -453 ml   Filed Weights   05/26/20 1211 05/27/20 0400  Weight: 75.3 kg 72 kg    Telemetry    Sinus with V pacing - Personally Reviewed  Physical Exam   GEN- The patient is well appearing, alert and oriented x 3 today.   Head- normocephalic, atraumatic Eyes-  Sclera clear, conjunctiva pink Ears- hearing intact Oropharynx- clear Neck- supple, Lungs-  normal work of breathing Heart- Regular rate and rhythm  GI- soft  Extremities- no clubbing, cyanosis, or edema  No pocket hematoma   Labs    Chemistry Recent Labs  Lab 05/26/20 1258 05/27/20 0321  NA 138 137  K 4.5 4.0  CL 108 108  CO2 19* 17*  GLUCOSE 223* 159*  BUN 41* 35*  CREATININE 1.93* 1.74*  CALCIUM 9.5 9.6  GFRNONAA 36* 41*  ANIONGAP 11 12     Hematology Recent  Labs  Lab 05/26/20 1258  WBC 6.3  RBC 5.66  HGB 17.1*  HCT 53.1*  MCV 93.8  MCH 30.2  MCHC 32.2  RDW 11.9  PLT 148*     Assessment & Plan    1.  Complete heart block Doing well s/p PPM implant CXR reveals stable leads, no ptx Device interrogation is personally reviewed and normal  DC to home with routine wound care and follow-up  07/26/20 MD, [facility] 05/27/2020 8:45 AM

## 2020-05-27 NOTE — Progress Notes (Signed)
D/C instructions given and reviewed. No questions asked but encouraged to call with any concerns. 

## 2020-05-27 NOTE — Discharge Summary (Signed)
ELECTROPHYSIOLOGY PROCEDURE DISCHARGE SUMMARY    Patient ID: Gabriel Velazquez,  MRN: [id number], DOB/AGE: [birth year] 64 y.o.  Admit date: 05/26/2020 Discharge date: 05/27/2020  Primary Care Physician: Hadley Pen, MD  Primary Cardiologist: No primary care provider on file.  Electrophysiologist: New to Dr. Johney Frame  Primary Discharge Diagnosis:  Symptomatic complete heart block status post pacemaker implantation this admission  Secondary Discharge Diagnosis:  HTN DM2 PAF H/o CVA CKD III  No Known Allergies  Procedures This Admission:  1.  Implantation of a Medtronic dual chamber PPM on 05/26/2020 by Dr. Johney Frame. The patient received a Medtronic model number [id number] PPM with model number [id number] right atrial lead and [id number] right ventricular lead. There were no immediate post procedure complications. 2.  CXR on 05/27/20 demonstrated no pneumothorax status post device implantation.   Brief HPI: Gabriel Velazquez is a 64 y.o. male was admitted for CHB and electrophysiology team asked to see for consideration of PPM implantation.  Past medical history includes above.  The patient has had symptomatic bradycardia without reversible causes identified.  Risks, benefits, and alternatives to PPM implantation were reviewed with the patient who wished to proceed.   Hospital Course:  The patient was admitted and underwent implantation of a Medtronic dual chamber PPM with details as outlined above.  He was monitored on telemetry overnight which demonstrated AS-VPacing.  Left chest was without hematoma or ecchymosis.  The device was interrogated and found to be functioning normally.  CXR was obtained and demonstrated no pneumothorax status post device implantation.  Wound care, arm mobility, and restrictions were reviewed with the patient.  The patient was examined and considered stable for discharge to home.    Regarding blood thinner therapy, they were instructed to resume their Eliquis on Sunday  morning, 05/29/2020.    Physical Exam: Vitals:   05/26/20 2344 05/27/20 0400 05/27/20 0510 05/27/20 0800  BP: 136/81  135/82 (!) 151/84  Pulse: 69  63 75  Resp: 18  18 18   Temp: 98.2 F (36.8 C)  98.3 F (36.8 C) 98.1 F (36.7 C)  TempSrc: Oral  Oral Oral  SpO2: 96%  97% 99%  Weight:  72 kg    Height:        GEN- The patient is well appearing, alert and oriented x 3 today.   HEENT: normocephalic, atraumatic; sclera clear, conjunctiva pink; hearing intact; oropharynx clear; neck supple, no JVP Lymph- no cervical lymphadenopathy Lungs- Clear to ausculation bilaterally, normal work of breathing.  No wheezes, rales, rhonchi Heart- Regular rate and rhythm, no murmurs, rubs or gallops, PMI not laterally displaced GI- soft, non-tender, non-distended, bowel sounds present, no hepatosplenomegaly Extremities- no clubbing, cyanosis, or edema; DP/PT/radial pulses 2+ bilaterally MS- no significant deformity or atrophy Skin- warm and dry, no rash or lesion, left chest without hematoma/ecchymosis Psych- euthymic mood, full affect Neuro- strength and sensation are intact   Labs:   Lab Results  Component Value Date   WBC 6.3 05/26/2020   HGB 17.1 (H) 05/26/2020   HCT 53.1 (H) 05/26/2020   MCV 93.8 05/26/2020   PLT 148 (L) 05/26/2020    Recent Labs  Lab 05/27/20 0321  NA 137  K 4.0  CL 108  CO2 17*  BUN 35*  CREATININE 1.74*  CALCIUM 9.6  GLUCOSE 159*    Discharge Medications:  Allergies as of 05/27/2020   No Known Allergies     Medication List    TAKE these medications   acetaminophen 325 MG tablet  Commonly known as: TYLENOL Take 1-2 tablets (325-650 mg total) by mouth every 4 (four) hours as needed for mild pain.   amLODipine 10 MG tablet Commonly known as: NORVASC Take 10 mg by mouth daily.   apixaban 5 MG Tabs tablet Commonly known as: Eliquis Take 1 tablet (5 mg total) by mouth 2 (two) times daily. Resume Sunday with your MORNING dose. Start taking on:  May 29, 2020 What changed:   additional instructions  These instructions start on May 29, 2020. If you are unsure what to do until then, ask your doctor or other care provider.   atorvastatin 40 MG tablet Commonly known as: LIPITOR Take 40 mg by mouth daily.   empagliflozin 10 MG Tabs tablet Commonly known as: JARDIANCE Take 10 mg by mouth daily.   hydrALAZINE 10 MG tablet Commonly known as: APRESOLINE Take 10 mg by mouth 3 (three) times daily.   hydroxypropyl methylcellulose / hypromellose 2.5 % ophthalmic solution Commonly known as: ISOPTO TEARS / GONIOVISC Place 1 drop into both eyes as needed for dry eyes.   hydrOXYzine 25 MG tablet Commonly known as: ATARAX/VISTARIL Take 25 mg by mouth every 8 (eight) hours as needed for anxiety.   lisinopril-hydrochlorothiazide 20-12.5 MG tablet Commonly known as: ZESTORETIC Take 1 tablet by mouth daily.   sitaGLIPtin 100 MG tablet Commonly known as: JANUVIA Take 100 mg by mouth daily.       Disposition:    Follow-up Information    [facility] Follow up on 06/09/2020.   Why: at 0840 for post pacemaker wound check Contact information: [address] [place] [address] [phone]       Hillis Range, MD Follow up on 08/31/2020.   Specialty: Cardiology Why: at 330 pm for 3 month pacemaker follow up Contact information: [address] [place] [address] [phone]               Duration of Discharge Encounter: Greater than 30 minutes including physician time.  Dustin Flock, PA-C  05/27/2020 8:52 AM

## 2020-05-27 NOTE — Discharge Instructions (Signed)
After Your Pacemaker    You have a Medtronic Pacemaker  ACTIVITY  Do not lift your arm above shoulder height for 1 week after your procedure. After 7 days, you may progress as below.     Friday June 03, 2020  Saturday June 04, 2020 Sunday June 05, 2020 Monday June 06, 2020    Do not lift, push, pull, or carry anything over 10 pounds with the affected arm until 6 weeks (Friday July 08, 2020 ) after your procedure.    Do NOT DRIVE until you have been seen for your wound check, or as long as instructed by your healthcare provider.    Ask your healthcare provider when you can go back to work   INCISION/Dressing  Resume your Eliquis Sunday MORNING, 05/29/2020.   Monitor your Pacemaker site for redness, swelling, and drainage. Call the device clinic at [phone] if you experience these symptoms or fever/chills.   If your incision is sealed with Steri-strips or staples, you may shower 10 days after your procedure or when told by your provider. Do not remove the steri-strips or let the shower hit directly on your site. You may wash around your site with soap and water.     Avoid lotions, ointments, or perfumes over your incision until it is well-healed.   You may use a hot tub or a pool AFTER your wound check appointment if the incision is completely closed.   PAcemaker Alerts:  Some alerts are vibratory and others beep. These are NOT emergencies. Please call our office to let us know. If this occurs at night or on weekends, it can wait until the next business day. Send a remote transmission.   If your device is capable of reading fluid status (for heart failure), you will be offered monthly monitoring to review this with you.   DEVICE MANAGEMENT  Remote monitoring is used to monitor your pacemaker from home. This monitoring is scheduled every 91 days by our office. It allows [place] to keep an eye on the functioning of your device to ensure it is working  properly. You will routinely see your Electrophysiologist annually (more often if necessary).    You should receive your ID card for your new device in 4-8 weeks. Keep this card with you at all times once received. Consider wearing a medical alert bracelet or necklace.   Your Pacemaker may be MRI compatible. This will be discussed at your next office visit/wound check.  You should avoid contact with strong electric or magnetic fields.    Do not use amateur (ham) radio equipment or electric (arc) welding torches. MP3 player headphones with magnets should not be used. Some devices are safe to use if held at least 12 inches (30 cm) from your Pacemaker. These include power tools, lawn mowers, and speakers. If you are unsure if something is safe to use, ask your health care provider.   When using your cell phone, hold it to the ear that is on the opposite side from the Pacemaker. Do not leave your cell phone in a pocket over the Pacemaker.   You may safely use electric blankets, heating pads, computers, and microwave ovens.  Call the office right away if:  You have chest pain.  You feel more short of breath than you have felt before.  You feel more light-headed than you have felt before.  Your incision starts to open up.  This information is not intended to replace advice given to you by your health  care provider. Make sure you discuss any questions you have with your health care provider.

## 2020-05-27 NOTE — Plan of Care (Signed)
  Problem: Education: Goal: Knowledge of General Education information will improve Description: Including pain rating scale, medication(s)/side effects and non-pharmacologic comfort measures Outcome: Adequate for Discharge   Problem: Health Behavior/Discharge Planning: Goal: Ability to manage health-related needs will improve Outcome: Adequate for Discharge   Problem: Clinical Measurements: Goal: Ability to maintain clinical measurements within normal limits will improve Outcome: Adequate for Discharge Goal: Diagnostic test results will improve Outcome: Adequate for Discharge Goal: Respiratory complications will improve Outcome: Adequate for Discharge Goal: Cardiovascular complication will be avoided Outcome: Adequate for Discharge   Problem: Pain Managment: Goal: General experience of comfort will improve Outcome: Adequate for Discharge   Problem: Skin Integrity: Goal: Risk for impaired skin integrity will decrease Outcome: Adequate for Discharge

## 2020-06-09 ENCOUNTER — Other Ambulatory Visit: Payer: Self-pay

## 2020-06-09 ENCOUNTER — Ambulatory Visit (INDEPENDENT_AMBULATORY_CARE_PROVIDER_SITE_OTHER): Payer: PRIVATE HEALTH INSURANCE | Admitting: Emergency Medicine

## 2020-06-09 DIAGNOSIS — I442 Atrioventricular block, complete: Secondary | ICD-10-CM

## 2020-06-09 LAB — CUP PACEART INCLINIC DEVICE CHECK
Battery Remaining Longevity: 140 mo
Battery Voltage: 3.22 V
Brady Statistic AP VP Percent: 36.6 %
Brady Statistic AP VS Percent: 0 %
Brady Statistic AS VP Percent: 63.39 %
Brady Statistic AS VS Percent: 0 %
Brady Statistic RA Percent Paced: 36.6 %
Brady Statistic RV Percent Paced: 100 %
Date Time Interrogation Session: 20211021103519
Implantable Lead Implant Date: 20211007
Implantable Lead Implant Date: 20211007
Implantable Lead Location: 753859
Implantable Lead Location: 753860
Implantable Lead Model: 3830
Implantable Lead Model: 5076
Implantable Pulse Generator Implant Date: 20211007
Lead Channel Impedance Value: 380 Ohm
Lead Channel Impedance Value: 494 Ohm
Lead Channel Impedance Value: 551 Ohm
Lead Channel Impedance Value: 684 Ohm
Lead Channel Pacing Threshold Amplitude: 0.5 V
Lead Channel Pacing Threshold Amplitude: 0.75 V
Lead Channel Pacing Threshold Pulse Width: 0.4 ms
Lead Channel Pacing Threshold Pulse Width: 0.4 ms
Lead Channel Sensing Intrinsic Amplitude: 19.625 mV
Lead Channel Sensing Intrinsic Amplitude: 5.875 mV
Lead Channel Setting Pacing Amplitude: 3.5 V
Lead Channel Setting Pacing Amplitude: 3.5 V
Lead Channel Setting Pacing Pulse Width: 0.4 ms
Lead Channel Setting Sensing Sensitivity: 2.8 mV

## 2020-06-09 NOTE — Progress Notes (Signed)
Wound check appointment. Steri-strips removed. Wound without redness or edema. Incision edges approximated, wound well healed. Normal device function. Thresholds, RA sensing, and impedances consistent with implant measurements. Device programmed at 3.5V for extra safety margin until 3 month visit. Histogram distribution appropriate for patient and level of activity. No mode switches or high ventricular rates noted. Patient educated about wound care, arm mobility, lifting restrictions.  Patient is enrolled in remote monitoring, next scheduled check 08/26/20.   ROV with Dr. Johney Frame on 08/31/20.

## 2020-08-26 ENCOUNTER — Ambulatory Visit (INDEPENDENT_AMBULATORY_CARE_PROVIDER_SITE_OTHER): Payer: PRIVATE HEALTH INSURANCE

## 2020-08-26 DIAGNOSIS — I442 Atrioventricular block, complete: Secondary | ICD-10-CM

## 2020-08-26 LAB — CUP PACEART REMOTE DEVICE CHECK
Battery Remaining Longevity: 145 mo
Battery Voltage: 3.21 V
Brady Statistic AP VP Percent: 19.89 %
Brady Statistic AP VS Percent: 0 %
Brady Statistic AS VP Percent: 80.1 %
Brady Statistic AS VS Percent: 0.01 %
Brady Statistic RA Percent Paced: 19.89 %
Brady Statistic RV Percent Paced: 99.99 %
Date Time Interrogation Session: 20220106232020
Implantable Lead Implant Date: 20211007
Implantable Lead Implant Date: 20211007
Implantable Lead Location: 753859
Implantable Lead Location: 753860
Implantable Lead Model: 3830
Implantable Lead Model: 5076
Implantable Pulse Generator Implant Date: 20211007
Lead Channel Impedance Value: 342 Ohm
Lead Channel Impedance Value: 494 Ohm
Lead Channel Impedance Value: 589 Ohm
Lead Channel Impedance Value: 627 Ohm
Lead Channel Pacing Threshold Amplitude: 0.625 V
Lead Channel Pacing Threshold Amplitude: 0.875 V
Lead Channel Pacing Threshold Pulse Width: 0.4 ms
Lead Channel Pacing Threshold Pulse Width: 0.4 ms
Lead Channel Sensing Intrinsic Amplitude: 19.625 mV
Lead Channel Sensing Intrinsic Amplitude: 4.75 mV
Lead Channel Sensing Intrinsic Amplitude: 4.75 mV
Lead Channel Setting Pacing Amplitude: 2 V
Lead Channel Setting Pacing Amplitude: 3.5 V
Lead Channel Setting Pacing Pulse Width: 0.4 ms
Lead Channel Setting Sensing Sensitivity: 2.8 mV

## 2020-08-31 ENCOUNTER — Encounter: Payer: PRIVATE HEALTH INSURANCE | Admitting: Internal Medicine

## 2020-09-05 ENCOUNTER — Encounter: Payer: PRIVATE HEALTH INSURANCE | Admitting: Cardiology

## 2020-09-09 NOTE — Progress Notes (Signed)
Remote pacemaker transmission.   

## 2020-09-14 ENCOUNTER — Other Ambulatory Visit: Payer: Self-pay

## 2020-09-14 ENCOUNTER — Encounter: Payer: Self-pay | Admitting: Internal Medicine

## 2020-09-14 ENCOUNTER — Ambulatory Visit (INDEPENDENT_AMBULATORY_CARE_PROVIDER_SITE_OTHER): Payer: PRIVATE HEALTH INSURANCE | Admitting: Internal Medicine

## 2020-09-14 VITALS — BP 142/84 | HR 76 | Ht 69.0 in | Wt 158.8 lb

## 2020-09-14 DIAGNOSIS — I442 Atrioventricular block, complete: Secondary | ICD-10-CM

## 2020-09-14 DIAGNOSIS — I10 Essential (primary) hypertension: Secondary | ICD-10-CM

## 2020-09-14 DIAGNOSIS — I480 Paroxysmal atrial fibrillation: Secondary | ICD-10-CM | POA: Diagnosis not present

## 2020-09-14 NOTE — Progress Notes (Signed)
PCP: Hadley Pen, MD Primary Cardiologist: Dr Saralyn Pilar Primary EP:  Dr Resa Miner Jachim is a 65 y.o. male who presents today for routine electrophysiology followup.  Since pacemaker implant, the patient reports doing very well.  Today, he denies symptoms of palpitations, chest pain, shortness of breath,  lower extremity edema, dizziness, presyncope, or syncope.  The patient is otherwise without complaint today.   Past Medical History:  Diagnosis Date  . Atrial fibrillation (HCC)   . Bradycardia   . Cerebrovascular accident (CVA) (HCC)   . Combined hyperlipidemia   . Heart block   . History of diverticulosis   . Hypogonadism in male   . Impotence of organic origin   . Low back pain   . Mixed dyslipidemia   . Mobitz type 1 second degree atrioventricular block   . 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 Type 2 diabetes mellitus with hyperglycemia, without long-term current use of insulin (HCC)   . Uncontrolled hypertension    Past Surgical History:  Procedure Laterality Date  . BACK SURGERY    . colonscopy with polypectomy    . PACEMAKER IMPLANT N/A 05/26/2020   Procedure: PACEMAKER IMPLANT;  Surgeon: Hillis Range, MD;  Location: [facility];  Service: Cardiovascular;  Laterality: N/A;    ROS- all systems are reviewed and negative except as per HPI above  Current Outpatient Medications  Medication Sig Dispense Refill  . acetaminophen (TYLENOL) 325 MG tablet Take 1-2 tablets (325-650 mg total) by mouth every 4 (four) hours as needed for mild pain.    Marland Kitchen amLODipine (NORVASC) 10 MG tablet Take 10 mg by mouth daily.    Marland Kitchen apixaban (ELIQUIS) 5 MG TABS tablet Take 1 tablet (5 mg total) by mouth 2 (two) times daily. Resume Sunday with your MORNING dose. 60 tablet   . atorvastatin (LIPITOR) 40 MG tablet Take 40 mg by mouth daily.     . empagliflozin (JARDIANCE) 10 MG TABS tablet Take 10 mg by mouth daily.    . hydrALAZINE (APRESOLINE) 10 MG tablet Take  10 mg by mouth 3 (three) times daily.    . hydroxypropyl methylcellulose / hypromellose (ISOPTO TEARS / GONIOVISC) 2.5 % ophthalmic solution Place 1 drop into both eyes as needed for dry eyes.    . hydrOXYzine (ATARAX/VISTARIL) 25 MG tablet Take 25 mg by mouth every 8 (eight) hours as needed for anxiety.    Marland Kitchen lisinopril-hydrochlorothiazide (ZESTORETIC) 20-12.5 MG tablet Take 1 tablet by mouth daily.    . pantoprazole (PROTONIX) 40 MG tablet Take 1 tablet by mouth daily.    . sitaGLIPtin (JANUVIA) 100 MG tablet Take 100 mg by mouth daily.     No current facility-administered medications for this visit.    Physical Exam: Vitals:   09/14/20 1631  BP: (!) 142/84  Pulse: 76  SpO2: 99%  Weight: 158 lb 12.8 oz (72 kg)  Height: 5\' 9"  (1.753 m)    GEN- The patient is well appearing, alert and oriented x 3 today.   Head- normocephalic, atraumatic Eyes-  Sclera clear, conjunctiva pink Ears- hearing intact Oropharynx- clear Lungs- Clear to ausculation bilaterally, normal work of breathing Chest- pacemaker pocket is well healed Heart- Regular rate and rhythm, no murmurs, rubs or gallops, PMI not laterally displaced GI- soft, NT, ND, + BS Extremities- no clubbing, cyanosis, or edema  Pacemaker interrogation- reviewed in detail today,  See PACEART report  ekg tracing ordered today is personally reviewed and shows sinus rhythm  with left bundle pacing  Assessment and Plan:  1. Symptomatic complete heart block Normal pacemaker function See Pace Art report No changes today he is not device dependant today  2. HTN Stable No change required today  3. Prior stroke On eliquis  4. afib burden is 0 % chads2vasc score is 4. Continue eliquis  Risks, benefits and potential toxicities for medications prescribed and/or refilled reviewed with patient today.   Follow-up with Dr Bing Matter in [place] annually I am happy to see when needed  Hillis Range MD, [facility] 09/14/2020 4:32 PM

## 2020-09-14 NOTE — Patient Instructions (Signed)
Medication Instructions:  Your physician recommends that you continue on your current medications as directed. Please refer to the Current Medication list given to you today.  Labwork: None ordered.  Testing/Procedures: None ordered.  Follow-Up: Your physician wants you to follow-up in: 12 months with Dr. Laural Benes.   Any Other Special Instructions Will Be Listed Below (If Applicable).  If you need a refill on your cardiac medications before your next appointment, please call your pharmacy.

## 2020-10-24 LAB — CUP PACEART INCLINIC DEVICE CHECK
Battery Remaining Longevity: 145 mo
Battery Voltage: 3.2 V
Brady Statistic AP VP Percent: 20.4 %
Brady Statistic AP VS Percent: 0 %
Brady Statistic AS VP Percent: 79.59 %
Brady Statistic AS VS Percent: 0.01 %
Brady Statistic RA Percent Paced: 20.39 %
Brady Statistic RV Percent Paced: 99.99 %
Date Time Interrogation Session: 20220126163400
Implantable Lead Implant Date: 20211007
Implantable Lead Implant Date: 20211007
Implantable Lead Location: 753859
Implantable Lead Location: 753860
Implantable Lead Model: 3830
Implantable Lead Model: 5076
Implantable Pulse Generator Implant Date: 20211007
Lead Channel Impedance Value: 361 Ohm
Lead Channel Impedance Value: 494 Ohm
Lead Channel Impedance Value: 608 Ohm
Lead Channel Impedance Value: 627 Ohm
Lead Channel Pacing Threshold Amplitude: 1 V
Lead Channel Pacing Threshold Amplitude: 1.25 V
Lead Channel Pacing Threshold Pulse Width: 0.4 ms
Lead Channel Pacing Threshold Pulse Width: 0.4 ms
Lead Channel Sensing Intrinsic Amplitude: 13.4 mV
Lead Channel Sensing Intrinsic Amplitude: 5.4 mV
Lead Channel Setting Pacing Amplitude: 2 V
Lead Channel Setting Pacing Amplitude: 2.25 V
Lead Channel Setting Pacing Pulse Width: 0.4 ms
Lead Channel Setting Sensing Sensitivity: 2.8 mV

## 2020-11-25 ENCOUNTER — Ambulatory Visit (INDEPENDENT_AMBULATORY_CARE_PROVIDER_SITE_OTHER): Payer: PRIVATE HEALTH INSURANCE

## 2020-11-25 DIAGNOSIS — I442 Atrioventricular block, complete: Secondary | ICD-10-CM

## 2020-11-28 LAB — CUP PACEART REMOTE DEVICE CHECK
Battery Remaining Longevity: 124 mo
Battery Voltage: 3.17 V
Brady Statistic AP VP Percent: 20.05 %
Brady Statistic AP VS Percent: 0 %
Brady Statistic AS VP Percent: 79.95 %
Brady Statistic AS VS Percent: 0 %
Brady Statistic RA Percent Paced: 20.03 %
Brady Statistic RV Percent Paced: 100 %
Date Time Interrogation Session: 20220408142801
Implantable Lead Implant Date: 20211007
Implantable Lead Implant Date: 20211007
Implantable Lead Location: 753859
Implantable Lead Location: 753860
Implantable Lead Model: 3830
Implantable Lead Model: 5076
Implantable Pulse Generator Implant Date: 20211007
Lead Channel Impedance Value: 361 Ohm
Lead Channel Impedance Value: 456 Ohm
Lead Channel Impedance Value: 570 Ohm
Lead Channel Impedance Value: 608 Ohm
Lead Channel Pacing Threshold Amplitude: 0.625 V
Lead Channel Pacing Threshold Amplitude: 1.5 V
Lead Channel Pacing Threshold Pulse Width: 0.4 ms
Lead Channel Pacing Threshold Pulse Width: 0.4 ms
Lead Channel Sensing Intrinsic Amplitude: 13.375 mV
Lead Channel Sensing Intrinsic Amplitude: 4.25 mV
Lead Channel Sensing Intrinsic Amplitude: 4.25 mV
Lead Channel Setting Pacing Amplitude: 2 V
Lead Channel Setting Pacing Amplitude: 3 V
Lead Channel Setting Pacing Pulse Width: 0.4 ms
Lead Channel Setting Sensing Sensitivity: 2.8 mV

## 2020-12-08 NOTE — Progress Notes (Signed)
Remote pacemaker transmission.   

## 2021-02-24 ENCOUNTER — Ambulatory Visit (INDEPENDENT_AMBULATORY_CARE_PROVIDER_SITE_OTHER): Payer: No Typology Code available for payment source

## 2021-02-24 DIAGNOSIS — I442 Atrioventricular block, complete: Secondary | ICD-10-CM | POA: Diagnosis not present

## 2021-02-26 LAB — CUP PACEART REMOTE DEVICE CHECK
Battery Remaining Longevity: 106 mo
Battery Voltage: 3.08 V
Brady Statistic AP VP Percent: 23.87 %
Brady Statistic AP VS Percent: 0 %
Brady Statistic AS VP Percent: 76.12 %
Brady Statistic AS VS Percent: 0.01 %
Brady Statistic RA Percent Paced: 23.86 %
Brady Statistic RV Percent Paced: 99.99 %
Date Time Interrogation Session: 20220708031530
Implantable Lead Implant Date: 20211007
Implantable Lead Implant Date: 20211007
Implantable Lead Location: 753859
Implantable Lead Location: 753860
Implantable Lead Model: 3830
Implantable Lead Model: 5076
Implantable Pulse Generator Implant Date: 20211007
Lead Channel Impedance Value: 342 Ohm
Lead Channel Impedance Value: 380 Ohm
Lead Channel Impedance Value: 418 Ohm
Lead Channel Impedance Value: 532 Ohm
Lead Channel Pacing Threshold Amplitude: 0.625 V
Lead Channel Pacing Threshold Amplitude: 1.75 V
Lead Channel Pacing Threshold Pulse Width: 0.4 ms
Lead Channel Pacing Threshold Pulse Width: 0.4 ms
Lead Channel Sensing Intrinsic Amplitude: 13.375 mV
Lead Channel Sensing Intrinsic Amplitude: 3.875 mV
Lead Channel Sensing Intrinsic Amplitude: 3.875 mV
Lead Channel Setting Pacing Amplitude: 1.5 V
Lead Channel Setting Pacing Amplitude: 3.5 V
Lead Channel Setting Pacing Pulse Width: 0.4 ms
Lead Channel Setting Sensing Sensitivity: 2.8 mV

## 2021-03-17 NOTE — Progress Notes (Signed)
Remote pacemaker transmission.   

## 2021-03-28 IMAGING — CR DG CHEST 2V
2 series · 2 of 2 positions shown · non-contrast
Comparison: 05/26/2020

CLINICAL DATA: Pacer placement.

EXAM:
CHEST - 2 VIEW

[chest lat]
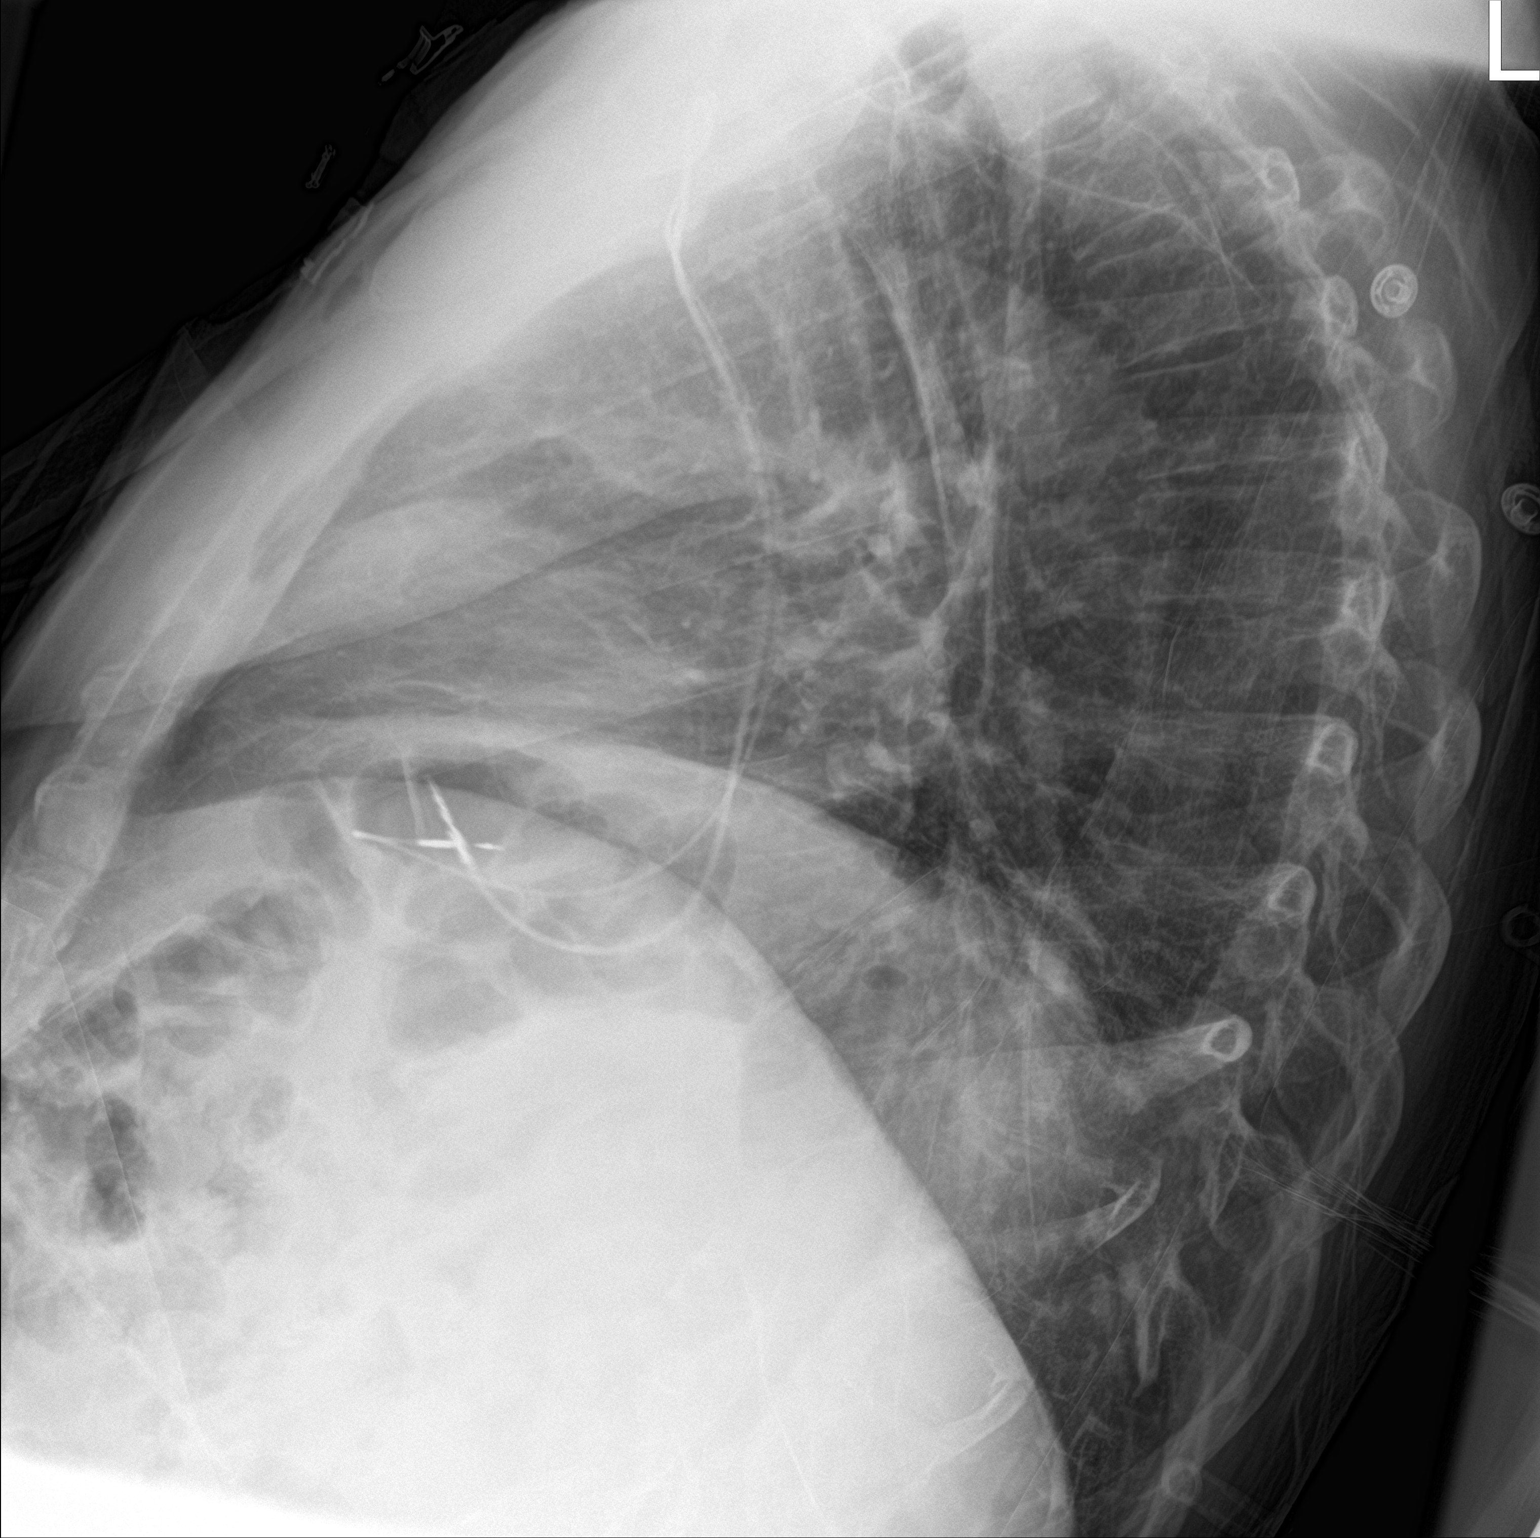

[chest ap]
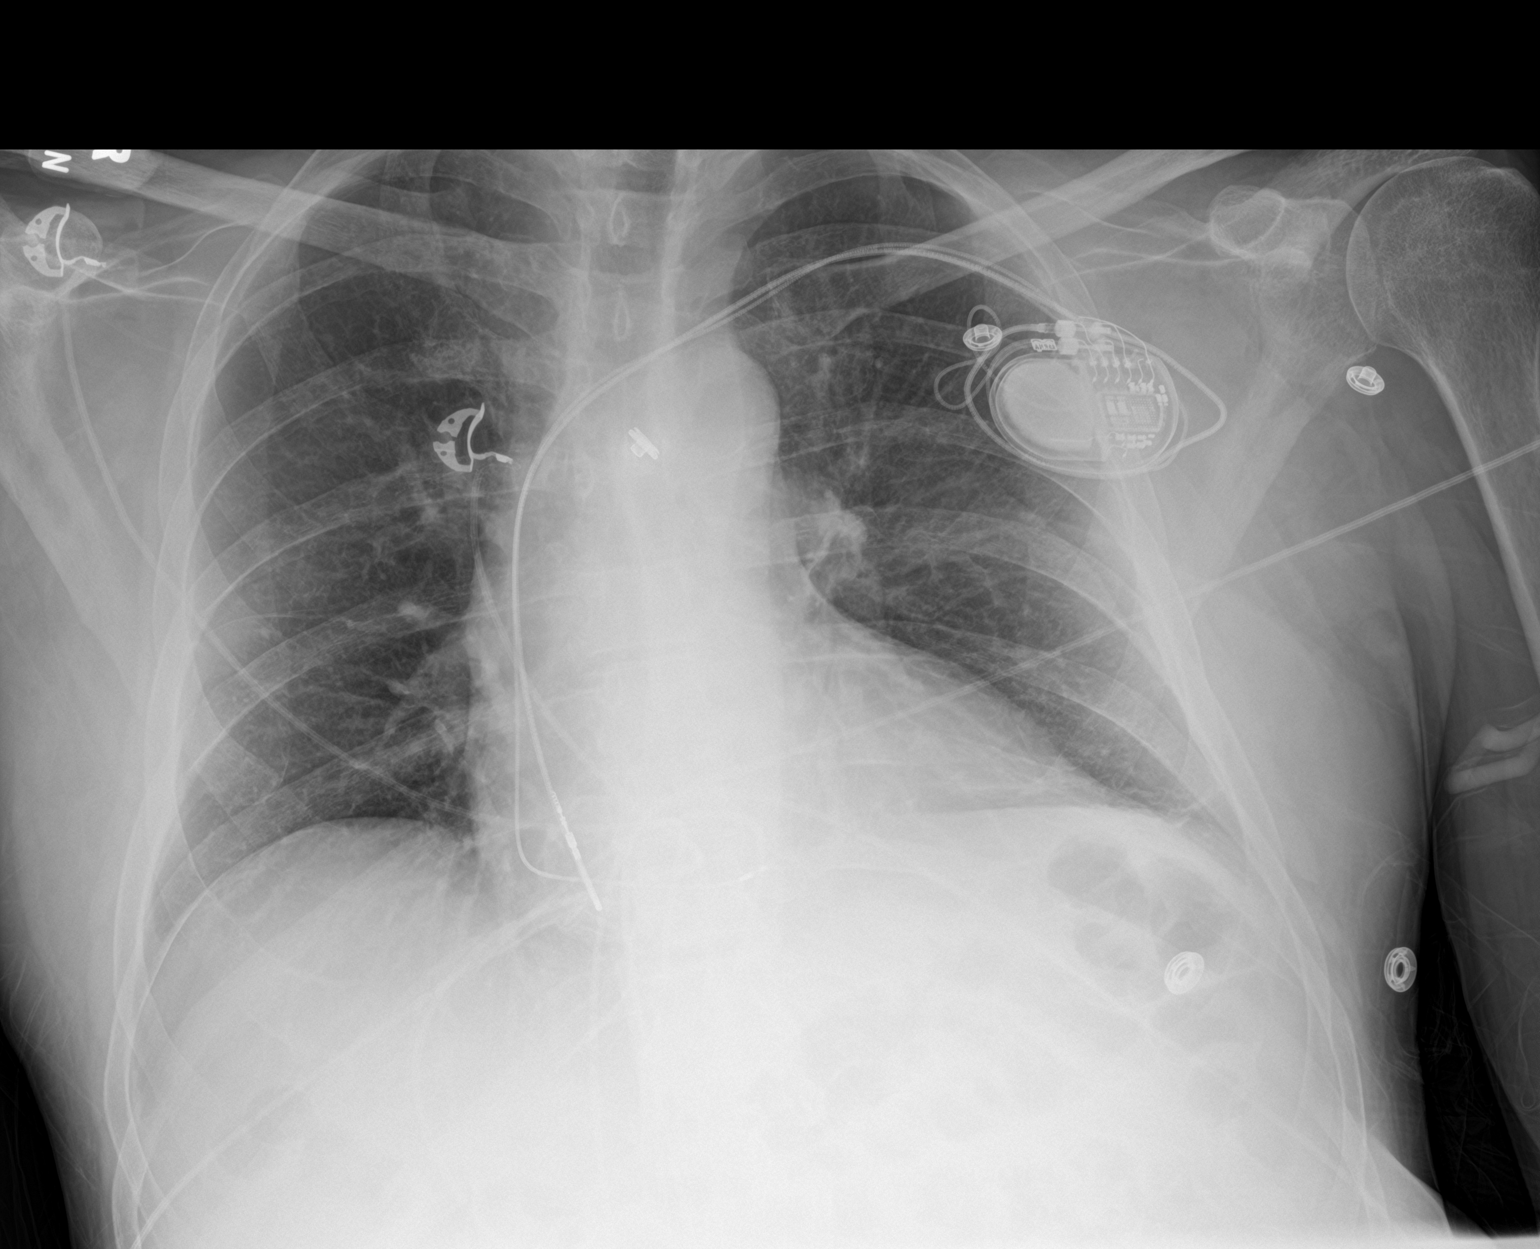

[2 of 2 positions shown; findings below may reference images not displayed]

FINDINGS: The lungs are clear without focal pneumonia, edema, pneumothorax or
pleural effusion. Left basilar atelectasis. Cardiopericardial
silhouette is at upper limits of normal for size. New left permanent
pacemaker noted without evidence for pneumothorax or pleural
effusion. The visualized bony structures of the thorax show no acute
abnormality.
IMPRESSION: Left base atelectasis.  No pneumothorax or pleural effusion.

## 2021-05-26 ENCOUNTER — Ambulatory Visit (INDEPENDENT_AMBULATORY_CARE_PROVIDER_SITE_OTHER): Payer: No Typology Code available for payment source

## 2021-05-26 DIAGNOSIS — I442 Atrioventricular block, complete: Secondary | ICD-10-CM | POA: Diagnosis not present

## 2021-05-29 LAB — CUP PACEART REMOTE DEVICE CHECK
Battery Remaining Longevity: 73 mo
Battery Voltage: 3.01 V
Brady Statistic AP VP Percent: 24.38 %
Brady Statistic AP VS Percent: 0 %
Brady Statistic AS VP Percent: 75.61 %
Brady Statistic AS VS Percent: 0.01 %
Brady Statistic RA Percent Paced: 24.37 %
Brady Statistic RV Percent Paced: 99.99 %
Date Time Interrogation Session: 20221007140410
Implantable Lead Implant Date: 20211007
Implantable Lead Implant Date: 20211007
Implantable Lead Location: 753859
Implantable Lead Location: 753860
Implantable Lead Model: 3830
Implantable Lead Model: 5076
Implantable Pulse Generator Implant Date: 20211007
Lead Channel Impedance Value: 361 Ohm
Lead Channel Impedance Value: 399 Ohm
Lead Channel Impedance Value: 418 Ohm
Lead Channel Impedance Value: 532 Ohm
Lead Channel Pacing Threshold Amplitude: 0.625 V
Lead Channel Pacing Threshold Amplitude: 2.5 V
Lead Channel Pacing Threshold Pulse Width: 0.4 ms
Lead Channel Pacing Threshold Pulse Width: 0.4 ms
Lead Channel Sensing Intrinsic Amplitude: 13.375 mV
Lead Channel Sensing Intrinsic Amplitude: 3.625 mV
Lead Channel Sensing Intrinsic Amplitude: 3.625 mV
Lead Channel Setting Pacing Amplitude: 1.5 V
Lead Channel Setting Pacing Amplitude: 5 V
Lead Channel Setting Pacing Pulse Width: 0.4 ms
Lead Channel Setting Sensing Sensitivity: 2.8 mV

## 2021-05-30 ENCOUNTER — Telehealth: Payer: Self-pay

## 2021-05-30 NOTE — Telephone Encounter (Signed)
Called patient to request device clinic apt. For device check. Patient advised trend increasing in wire readings and Dr. Johney Velazquez would like patient to come into device clinic for testing.   Patient request a call back later this morning to speak with his wife about transportation then will make apt.

## 2021-05-30 NOTE — Telephone Encounter (Signed)
Device clinic apt tomorrow made for 9:00. Location, date and time discussed with patient with verbal understanding.

## 2021-05-30 NOTE — Telephone Encounter (Signed)
-----   Message from Hillis Range, MD sent at 05/30/2021  8:02 AM EDT ----- Remote device check reviewed.   Device report notable for:  abnormal RV threshold is noted.   Bring into the office for manual thresholds and to check unipolar RV threshold as well.

## 2021-05-31 ENCOUNTER — Ambulatory Visit (INDEPENDENT_AMBULATORY_CARE_PROVIDER_SITE_OTHER): Payer: No Typology Code available for payment source

## 2021-05-31 ENCOUNTER — Other Ambulatory Visit: Payer: Self-pay

## 2021-05-31 DIAGNOSIS — R001 Bradycardia, unspecified: Secondary | ICD-10-CM | POA: Diagnosis not present

## 2021-06-01 LAB — CUP PACEART INCLINIC DEVICE CHECK
Battery Remaining Longevity: 99 mo
Battery Voltage: 3.01 V
Brady Statistic AP VP Percent: 23.02 %
Brady Statistic AP VS Percent: 0 %
Brady Statistic AS VP Percent: 76.97 %
Brady Statistic AS VS Percent: 0.01 %
Brady Statistic RA Percent Paced: 23.01 %
Brady Statistic RV Percent Paced: 99.99 %
Date Time Interrogation Session: 20221012092000
Implantable Lead Implant Date: 20211007
Implantable Lead Implant Date: 20211007
Implantable Lead Location: 753859
Implantable Lead Location: 753860
Implantable Lead Model: 3830
Implantable Lead Model: 5076
Implantable Pulse Generator Implant Date: 20211007
Lead Channel Impedance Value: 361 Ohm
Lead Channel Impedance Value: 399 Ohm
Lead Channel Impedance Value: 399 Ohm
Lead Channel Impedance Value: 532 Ohm
Lead Channel Pacing Threshold Amplitude: 0.5 V
Lead Channel Pacing Threshold Amplitude: 1.25 V
Lead Channel Pacing Threshold Pulse Width: 0.4 ms
Lead Channel Pacing Threshold Pulse Width: 1 ms
Lead Channel Sensing Intrinsic Amplitude: 11 mV
Lead Channel Sensing Intrinsic Amplitude: 3.875 mV
Lead Channel Setting Pacing Amplitude: 1.5 V
Lead Channel Setting Pacing Amplitude: 2.5 V
Lead Channel Setting Pacing Pulse Width: 1 ms
Lead Channel Setting Sensing Sensitivity: 2.8 mV

## 2021-06-01 NOTE — Progress Notes (Signed)
Pacemaker check in clinic for elevated RV thresholds with safety margin 4.75@0 .4. RV left bundle pacing noted. Thresholds checked unipolar and bipolar (see attachment) without phrenic stimulation. After consulting with Dr. Johney Frame device programmed unipolar 2.5@1 .0 with threshold 1.25V.  Otherwise normal device function. RA Thresholds, sensing, impedances consistent with previous measurements. Device programmed to maximize longevity. No mode switch or high ventricular rates noted. Histogram distribution appropriate for patient activity level. Estimated longevity 8 years, 3 months. Patient enrolled in remote follow-up. Patient education completed.

## 2021-06-05 NOTE — Progress Notes (Signed)
Remote pacemaker transmission.   

## 2021-08-19 ENCOUNTER — Emergency Department (HOSPITAL_COMMUNITY): Payer: No Typology Code available for payment source

## 2021-08-19 ENCOUNTER — Emergency Department (HOSPITAL_COMMUNITY)
Admission: EM | Admit: 2021-08-19 | Discharge: 2021-08-19 | Disposition: A | Discharge status: Home / Self Care | Payer: No Typology Code available for payment source | Attending: Emergency Medicine | Admitting: Emergency Medicine

## 2021-08-19 DIAGNOSIS — I10 Essential (primary) hypertension: Secondary | ICD-10-CM | POA: Insufficient documentation

## 2021-08-19 DIAGNOSIS — R0602 Shortness of breath: Secondary | ICD-10-CM | POA: Insufficient documentation

## 2021-08-19 DIAGNOSIS — E119 Type 2 diabetes mellitus without complications: Secondary | ICD-10-CM | POA: Diagnosis not present

## 2021-08-19 DIAGNOSIS — I442 Atrioventricular block, complete: Secondary | ICD-10-CM | POA: Diagnosis not present

## 2021-08-19 DIAGNOSIS — Z7901 Long term (current) use of anticoagulants: Secondary | ICD-10-CM | POA: Insufficient documentation

## 2021-08-19 DIAGNOSIS — Z79899 Other long term (current) drug therapy: Secondary | ICD-10-CM | POA: Insufficient documentation

## 2021-08-19 DIAGNOSIS — T82198A Other mechanical complication of other cardiac electronic device, initial encounter: Secondary | ICD-10-CM | POA: Insufficient documentation

## 2021-08-19 DIAGNOSIS — T829XXA Unspecified complication of cardiac and vascular prosthetic device, implant and graft, initial encounter: Secondary | ICD-10-CM

## 2021-08-19 LAB — BASIC METABOLIC PANEL
Anion gap: 16 — ABNORMAL HIGH (ref 5–15)
BUN: 30 mg/dL — ABNORMAL HIGH (ref 8–23)
CO2: 14 mmol/L — ABNORMAL LOW (ref 22–32)
Calcium: 9.5 mg/dL (ref 8.9–10.3)
Chloride: 103 mmol/L (ref 98–111)
Creatinine, Ser: 1.69 mg/dL — ABNORMAL HIGH (ref 0.61–1.24)
GFR, Estimated: 44 mL/min — ABNORMAL LOW (ref >60)
Glucose, Bld: 171 mg/dL — ABNORMAL HIGH (ref 70–99)
Potassium: 4.7 mmol/L (ref 3.5–5.1)
Sodium: 133 mmol/L — ABNORMAL LOW (ref 135–145)

## 2021-08-19 LAB — TROPONIN I (HIGH SENSITIVITY)
Troponin I (High Sensitivity): 7 ng/L (ref <18)
Troponin I (High Sensitivity): 7 ng/L (ref <18)

## 2021-08-19 NOTE — ED Notes (Signed)
Medtonric pacer interrogated

## 2021-08-19 NOTE — ED Provider Notes (Signed)
[facility] EMERGENCY DEPARTMENT Provider Note   CSN: [id number] Arrival date & time: 08/19/21  5397     History Chief Complaint  Patient presents with   Pacemaker Problem   Shortness of Breath    Gabriel Velazquez is a 65 y.o. male.  HPI Gabriel Velazquez , a 65 y.o. male  was evaluated in triage.  Pt complains of feeling like his pacemaker is beating all the time.  This is been ongoing for the past 3 to 4 days.  He can see the pacemaker constantly pulsing in his chest.  He reports with this he has felt a bit short of breath but denies associated chest pain, no lightheadedness or syncope.  He reports he was diagnosed with COVID 6 days ago and has been quarantining at home.  He reports he was having similar issues back in October and they had to make some adjustments to his pacemaker settings.    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   Type 2 diabetes mellitus with hyperglycemia, without long-term current use of insulin (HCC)    Uncontrolled hypertension     Patient Active Problem List   Diagnosis Date Noted   Heart block AV complete (HCC) 05/26/2020   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   Complete heart block (HCC) 05/26/2020   Paroxysmal atrial fibrillation (HCC) 04/22/2020   Cerebrovascular accident (CVA) (HCC) 04/22/2020   Hospital discharge follow-up 04/22/2020   Bradycardia 07/02/2018   Mobitz type 1 second degree atrioventricular block 07/02/2018   Annual physical exam 07/02/2018   Combined hyperlipidemia 06/25/2017   History of diverticulosis 06/25/2017   Hypogonadism in male  06/25/2017   ED (erectile dysfunction) of organic origin 06/25/2017   Low back pain 06/25/2017   Mixed dyslipidemia 06/25/2017   Type 2 diabetes mellitus with hyperglycemia, without long-term current use of insulin (HCC) 06/25/2017   Essential hypertension 04/09/2017    Past Surgical History:  Procedure Laterality Date   BACK SURGERY     colonscopy with polypectomy     PACEMAKER IMPLANT N/A 05/26/2020   Procedure: PACEMAKER IMPLANT;  Surgeon: Hillis Range, MD;  Location: [facility];  Service: Cardiovascular;  Laterality: N/A;       Family History  Problem Relation Age of Onset   Throat cancer Mother    Heart disease Father    Heart disease Brother     Social History   Tobacco Use   Smoking status: Never   Smokeless tobacco: Never  Substance Use Topics   Alcohol use: Never   Drug use: Never    Home Medications Prior to Admission medications   Medication Sig Start Date End Date Taking? Authorizing Provider  acetaminophen (TYLENOL) 325 MG tablet Take 1-2 tablets (325-650 mg total) by mouth every 4 (four) hours as needed for mild pain. 05/27/20   Graciella Freer, PA-C  amLODipine (NORVASC) 10 MG tablet Take 10 mg by mouth daily. 11/16/19   [provider]  apixaban (ELIQUIS) 5 MG TABS tablet Take 1 tablet (5 mg total) by mouth 2 (  two) times daily. Resume Sunday with your MORNING dose. 05/29/20   Graciella Freer, PA-C  atorvastatin (LIPITOR) 40 MG tablet Take 40 mg by mouth daily.  07/02/18   [provider]  empagliflozin (JARDIANCE) 10 MG TABS tablet Take 10 mg by mouth daily. 05/24/20   [provider]  hydrALAZINE (APRESOLINE) 10 MG tablet Take 10 mg by mouth 3 (three) times daily. 05/11/20   [provider]  hydroxypropyl methylcellulose / hypromellose (ISOPTO TEARS / GONIOVISC) 2.5 % ophthalmic solution Place 1 drop into both eyes as needed for dry eyes.    [provider]  hydrOXYzine (ATARAX/VISTARIL) 25  MG tablet Take 25 mg by mouth every 8 (eight) hours as needed for anxiety. 05/11/20   [provider]  lisinopril-hydrochlorothiazide (ZESTORETIC) 20-12.5 MG tablet Take 1 tablet by mouth daily.    [provider]  pantoprazole (PROTONIX) 40 MG tablet Take 1 tablet by mouth daily. 06/28/20   [provider]  sitaGLIPtin (JANUVIA) 100 MG tablet Take 100 mg by mouth daily. 04/22/20   [provider]    Allergies    Patient has no known allergies.  Review of Systems   Review of Systems 10 systems reviewed negative except as per HPI Physical Exam Updated Vital Signs BP 127/73    Pulse 73    Temp 98.2 F (36.8 C) (Oral)    Resp (!) 21    SpO2 100%   Physical Exam Constitutional:      Appearance: Normal appearance.  HENT:     Mouth/Throat:     Pharynx: Oropharynx is clear.  Cardiovascular:     Rate and Rhythm: Normal rate and regular rhythm.     Comments: The pectoralis muscle just adjacent to the pacer pocket is observed to contract with each pacer discharge. Pulmonary:     Effort: Pulmonary effort is normal.     Breath sounds: Normal breath sounds.  Abdominal:     General: There is no distension.     Palpations: Abdomen is soft.     Tenderness: There is no abdominal tenderness. There is no guarding.  Musculoskeletal:        General: Normal range of motion.     Right lower leg: No edema.     Left lower leg: No edema.  Skin:    General: Skin is warm and dry.  Neurological:     General: No focal deficit present.     Mental Status: He is alert and oriented to person, place, and time.     Coordination: Coordination normal.    ED Results / Procedures / Treatments   Labs (all labs ordered are listed, but only abnormal results are displayed) Labs Reviewed  BASIC METABOLIC PANEL - Abnormal; Notable for the following components:      Result Value   Sodium 133 (*)    CO2 14 (*)    Glucose, Bld 171 (*)    BUN 30 (*)    Creatinine, Ser 1.69 (*)     GFR, Estimated 44 (*)    Anion gap 16 (*)    All other components within normal limits  TROPONIN I (HIGH SENSITIVITY)  TROPONIN I (HIGH SENSITIVITY)    EKG EKG Interpretation  Date/Time:  Saturday August 19 2021 09:31:36 EST Ventricular Rate:  96 PR Interval:  166 QRS Duration: 152 QT Interval:  410 QTC Calculation: 517 R Axis:   149 Text Interpretation: Paced. no sig change from previous except higher rate Confirmed by Arby Barrette (  [id number]) on 08/19/2021 3:25:59 PM  Radiology DG Chest Port 1 View  Result Date: 08/19/2021 CLINICAL DATA:  Shortness of breath 4 days. EXAM: PORTABLE CHEST 1 VIEW COMPARISON:  May 27, 2020 FINDINGS: The heart size and mediastinal contours are stable. Cardiac pacemaker is unchanged. Both lungs are clear. The visualized skeletal structures are unremarkable. IMPRESSION: No active disease. Electronically Signed   By: Sherian Rein M.D.   On: 08/19/2021 10:52    Procedures Procedures   Medications Ordered in ED Medications - No data to display  ED Course  I have reviewed the triage vital signs and the nursing notes.  Pertinent labs & imaging results that were available during my care of the patient were reviewed by me and considered in my medical decision making (see chart for details).  Clinical Course as of 08/19/21 1525  Sat Aug 19, 2021  1225 Consult: Dr. Rennis Golden cardiology will discuss with Dr. Johney Frame to evaluate in the ED [MP]    Clinical Course User Index [MP] Arby Barrette, MD   MDM Rules/Calculators/A&P                         Patient presents with constant chest wall muscle contraction associated with pacemaker.  No other immediate problem. .Patient was recently diagnosed with COVID but is not symptomatic.  He has had fatigue but no significant shortness of breath.  He is not coughing.  Is otherwise generally well in appearance with stable vital signs.  Radiology consulted and EP has adjusted pacer settings eliminating the  problem of auxiliary contraction of the pectoralis adjacent to the pacemaker.   Final Clinical Impression(s) / ED Diagnoses Final diagnoses:  Disorder of cardiac pacemaker system, initial encounter    Rx / DC Orders ED Discharge Orders     None        Arby Barrette, MD 08/19/21 1528

## 2021-08-19 NOTE — ED Notes (Signed)
Cardiology at bedside.  MD given Medtronic pacemaker interrogation papers.

## 2021-08-19 NOTE — ED Provider Notes (Signed)
Emergency Medicine Provider Triage Evaluation Note  Gabriel Velazquez , a 65 y.o. male  was evaluated in triage.  Pt complains of feeling like his pacemaker is beating all the time.  This is been ongoing for the past 3 to 4 days.  He can see the pacemaker constantly pulsing in his chest.  He reports with this he has felt a bit short of breath but denies associated chest pain, no lightheadedness or syncope.  He reports he was diagnosed with COVID 6 days ago and has been quarantining at home.  He reports he was having similar issues back in October and they had to make some adjustments to his pacemaker settings.  Review of Systems  Positive: Palpitations, shortness of breath, weakness Negative: Fever, chest pain, syncope  Physical Exam  BP 130/77 (BP Location: Right Arm)    Pulse 96    Temp 98.2 F (36.8 C) (Oral)    Resp 16    SpO2 100%  Gen:   Awake, no distress   Resp:  Normal effort, CTA bilat Cardiac: Pacemaker visibly pulsing in chest, RRR MSK:   Moves extremities without difficulty   Medical Decision Making  Medically screening exam initiated at 9:38 AM.  Appropriate orders placed.  Fernando Stoiber was informed that the remainder of the evaluation will be completed by another provider, this initial triage assessment does not replace that evaluation, and the importance of remaining in the ED until their evaluation is complete.  Work-up initiated in triage but patient will need acute room for more immediate evaluation.   Dartha Lodge, PA-C 08/19/21 3343    Cathren Laine, MD 08/19/21 [phone]

## 2021-08-19 NOTE — ED Triage Notes (Signed)
Pt. Stated, My pacemaker started beating all the time or most of the time. It was put in a year ago.

## 2021-08-19 NOTE — Discharge Instructions (Signed)
1.  Follow-up with your cardiologist as planned.  The adjustment to your pacemaker should solve the issue you are having. 2.  Return to the emergency department if you develop chest pain, unusual shortness of breath, other concerning symptoms

## 2021-08-19 NOTE — ED Notes (Signed)
Medtronic re-set pacemaker.

## 2021-08-19 NOTE — Consult Note (Addendum)
ELECTROPHYSIOLOGY CONSULT NOTE    Primary Care Physician: Hadley Pen, MD Referring Physician:  Dr Donnald Garre  Admit Date: 08/19/2021  Reason for consultation:   pectoralis stim  Gabriel Velazquez is a 65 y.o. male with a h/o complete heart block s/p PPM (MDT) with left bundle conduction pacing lead 10/21 who presents with pectoralis stim.   He has chronically elevated RV threshold and has been programmed unipolar RV pacing at elevated output.  He has had rare pectoralis pacing in the past.  Over the past week, he has had increasing pectoralis pacing for unclear reasons.    He reports having URI symptoms and being diagnosed with COVID this past Monday.  He is improving clinically.  Today, he denies symptoms of palpitations, chest pain, shortness of breath, orthopnea, PND, lower extremity edema, dizziness, presyncope, syncope, or neurologic sequela. The patient is tolerating medications without difficulties and is otherwise without complaint today.   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   Type 2 diabetes mellitus with hyperglycemia, without long-term current use of insulin (HCC)    Uncontrolled hypertension    Past Surgical History:  Procedure Laterality Date   BACK SURGERY     colonscopy with polypectomy     PACEMAKER IMPLANT N/A 05/26/2020   Procedure: PACEMAKER IMPLANT;  Surgeon: Hillis Range, MD;  Location: [facility];  Service: Cardiovascular;  Laterality: N/A;   No Known Allergies  Social History   Socioeconomic History   Marital status: Married    Spouse name: Not on file   Number of children: Not on file    Years of education: Not on file   Highest education level: Not on file  Occupational History   Not on file  Tobacco Use   Smoking status: Never   Smokeless tobacco: Never  Substance and Sexual Activity   Alcohol use: Never   Drug use: Never   Sexual activity: Not on file  Other Topics Concern   Not on file  Social History Narrative   Not on file   Social Determinants of Health   Financial Resource Strain: Not on file  Food Insecurity: Not on file  Transportation Needs: Not on file  Physical Activity: Not on file  Stress: Not on file  Social Connections: Not on file  Intimate Partner Violence: Not on file    Family History  Problem Relation Age of Onset   Throat cancer Mother    Heart disease Father    Heart disease Brother     ROS- All systems are reviewed and negative except as per the HPI above  Physical Exam: Telemetry:  sinus with V pacing (unipolar) Vitals:   08/19/21 0928 08/19/21 1045 08/19/21 1230  BP: 130/77 119/85 127/73  Pulse: 96 81 73  Resp: 16 17 (!) 21  Temp: 98.2 F (36.8 C)    TempSrc: Oral    SpO2: 100% 99% 100%    GEN- The patient is well appearing, alert and oriented x 3 today.   Head- normocephalic, atraumatic Eyes-  Sclera clear, conjunctiva pink Ears- hearing intact  Oropharynx- clear Neck- supple,   Lungs-  normal work of breathing Heart- Regular rate and rhythm  GI- soft  Extremities- no clubbing, cyanosis, or edema MS- no significant deformity or atrophy Skin- no rash or lesion Psych- euthymic mood, full affect Neuro- strength and sensation are intact  EKG-  sinus with unipolar RV pacing  Labs:   Lab Results  Component Value Date   WBC 6.3 05/26/2020   HGB 17.1 (H) 05/26/2020   HCT 53.1 (H) 05/26/2020   MCV 93.8 05/26/2020   PLT 148 (L) 05/26/2020    Recent Labs  Lab 08/19/21 0913  NA 133*  K 4.7  CL 103  CO2 14*  BUN 30*  CREATININE 1.69*  CALCIUM 9.5  GLUCOSE 171*   No results found for: CKTOTAL, CKMB,  CKMBINDEX, TROPONINI No results found for: CHOL No results found for: HDL No results found for: LDLCALC No results found for: TRIG No results found for: CHOLHDL No results found for: LDLDIRECT    Radiology:   stable pacing leads    ASSESSMENT AND PLAN:   Pectoralis stimulation s/p PPM for CHB CLE interrogation reviewed Will have medtronic rep come in for manual testing and reprogramming.  Hopefully we can treat conservatively with reprogramming and discharge.  Hillis Range, MD 08/19/2021  1:12 PM  Addendum: Device interrogated with medtronic rep.  The patient's underlying rhythm is sinus with complete heart block.  Not dependant.  He has a robust narrow escape.  RV threshold is 3V @ 0.4 msec bipolar and 2.5V@0 .4 msec unipolar.  I have therefore programmed RV output as 3.5V @ 1 msec bipolar.   Pectoralis stim has resolved. Device function/ lead parameters are others very stable.  RV capture management is placed on passive.  We will follow closely remotely and keep routine office follow-up.  Ok to discharge to home.  Hillis Range MD, [facility] 08/19/2021

## 2021-08-25 ENCOUNTER — Ambulatory Visit (INDEPENDENT_AMBULATORY_CARE_PROVIDER_SITE_OTHER): Payer: No Typology Code available for payment source

## 2021-08-25 DIAGNOSIS — I442 Atrioventricular block, complete: Secondary | ICD-10-CM | POA: Diagnosis not present

## 2021-08-25 LAB — CUP PACEART REMOTE DEVICE CHECK
Battery Remaining Longevity: 62 mo
Battery Voltage: 2.98 V
Brady Statistic AP VP Percent: 17.33 %
Brady Statistic AP VS Percent: 0 %
Brady Statistic AS VP Percent: 82.66 %
Brady Statistic AS VS Percent: 0.02 %
Brady Statistic RA Percent Paced: 17.31 %
Brady Statistic RV Percent Paced: 99.98 %
Date Time Interrogation Session: 20230105224542
Implantable Lead Implant Date: 20211007
Implantable Lead Implant Date: 20211007
Implantable Lead Location: 753859
Implantable Lead Location: 753860
Implantable Lead Model: 3830
Implantable Lead Model: 5076
Implantable Pulse Generator Implant Date: 20211007
Lead Channel Impedance Value: 342 Ohm
Lead Channel Impedance Value: 380 Ohm
Lead Channel Impedance Value: 456 Ohm
Lead Channel Impedance Value: 475 Ohm
Lead Channel Pacing Threshold Amplitude: 0.625 V
Lead Channel Pacing Threshold Amplitude: 2 V
Lead Channel Pacing Threshold Pulse Width: 0.4 ms
Lead Channel Pacing Threshold Pulse Width: 0.4 ms
Lead Channel Sensing Intrinsic Amplitude: 10.25 mV
Lead Channel Sensing Intrinsic Amplitude: 10.25 mV
Lead Channel Sensing Intrinsic Amplitude: 3.875 mV
Lead Channel Sensing Intrinsic Amplitude: 3.875 mV
Lead Channel Setting Pacing Amplitude: 1.5 V
Lead Channel Setting Pacing Amplitude: 3.5 V
Lead Channel Setting Pacing Pulse Width: 1 ms
Lead Channel Setting Sensing Sensitivity: 2.8 mV

## 2021-09-04 NOTE — Progress Notes (Signed)
Remote pacemaker transmission.   

## 2021-09-25 ENCOUNTER — Encounter: Payer: No Typology Code available for payment source | Admitting: Cardiology

## 2021-11-24 ENCOUNTER — Ambulatory Visit (INDEPENDENT_AMBULATORY_CARE_PROVIDER_SITE_OTHER): Payer: No Typology Code available for payment source

## 2021-11-24 DIAGNOSIS — I442 Atrioventricular block, complete: Secondary | ICD-10-CM

## 2021-11-25 LAB — CUP PACEART REMOTE DEVICE CHECK
Battery Remaining Longevity: 61 mo
Battery Voltage: 2.98 V
Brady Statistic AP VP Percent: 14.24 %
Brady Statistic AP VS Percent: 0 %
Brady Statistic AS VP Percent: 85.54 %
Brady Statistic AS VS Percent: 0.22 %
Brady Statistic RA Percent Paced: 14.37 %
Brady Statistic RV Percent Paced: 99.78 %
Date Time Interrogation Session: 20230406221328
Implantable Lead Implant Date: 20211007
Implantable Lead Implant Date: 20211007
Implantable Lead Location: 753859
Implantable Lead Location: 753860
Implantable Lead Model: 3830
Implantable Lead Model: 5076
Implantable Pulse Generator Implant Date: 20211007
Lead Channel Impedance Value: 342 Ohm
Lead Channel Impedance Value: 380 Ohm
Lead Channel Impedance Value: 475 Ohm
Lead Channel Impedance Value: 494 Ohm
Lead Channel Pacing Threshold Amplitude: 0.625 V
Lead Channel Pacing Threshold Amplitude: 2.5 V
Lead Channel Pacing Threshold Pulse Width: 0.4 ms
Lead Channel Pacing Threshold Pulse Width: 0.4 ms
Lead Channel Sensing Intrinsic Amplitude: 3.875 mV
Lead Channel Sensing Intrinsic Amplitude: 3.875 mV
Lead Channel Sensing Intrinsic Amplitude: 9 mV
Lead Channel Sensing Intrinsic Amplitude: 9 mV
Lead Channel Setting Pacing Amplitude: 1.5 V
Lead Channel Setting Pacing Amplitude: 3.5 V
Lead Channel Setting Pacing Pulse Width: 1 ms
Lead Channel Setting Sensing Sensitivity: 2.8 mV

## 2021-12-11 ENCOUNTER — Ambulatory Visit (INDEPENDENT_AMBULATORY_CARE_PROVIDER_SITE_OTHER): Payer: No Typology Code available for payment source | Admitting: Cardiology

## 2021-12-11 ENCOUNTER — Encounter: Payer: Self-pay | Admitting: Cardiology

## 2021-12-11 VITALS — BP 138/84 | HR 78 | Ht 69.0 in | Wt 175.8 lb

## 2021-12-11 DIAGNOSIS — D6869 Other thrombophilia: Secondary | ICD-10-CM

## 2021-12-11 DIAGNOSIS — I442 Atrioventricular block, complete: Secondary | ICD-10-CM | POA: Diagnosis not present

## 2021-12-11 DIAGNOSIS — I480 Paroxysmal atrial fibrillation: Secondary | ICD-10-CM

## 2021-12-11 NOTE — Patient Instructions (Signed)
Medication Instructions:  ?Your physician recommends that you continue on your current medications as directed. Please refer to the Current Medication list given to you today. ? ?*If you need a refill on your cardiac medications before your next appointment, please call your pharmacy* ? ? ?Lab Work: ?None ordered ? ? ?Testing/Procedures: ?None ordered ? ? ?Follow-Up: ?At [facility], you and your health needs are our priority.  As part of our continuing mission to provide you with exceptional heart care, we have created designated Provider Care Teams.  These Care Teams include your primary Cardiologist (physician) and Advanced Practice Providers (APPs -  Physician Assistants and Nurse Practitioners) who all work together to provide you with the care you need, when you need it. ? ?We recommend signing up for the patient portal called "MyChart".  Sign up information is provided on this After Visit Summary.  MyChart is used to connect with patients for Virtual Visits (Telemedicine).  Patients are able to view lab/test results, encounter notes, upcoming appointments, etc.  Non-urgent messages can be sent to your provider as well.   ?To learn more about what you can do with MyChart, go to [email or web address].   ? ?Remote monitoring is used to monitor your Pacemaker or ICD from home. This monitoring reduces the number of office visits required to check your device to one time per year. It allows [place] to keep an eye on the functioning of your device to ensure it is working properly. You are scheduled for a device check from home on 02/23/2022. You may send your transmission at any time that day. If you have a wireless device, the transmission will be sent automatically. After your physician reviews your transmission, you will receive a postcard with your next transmission date. ? ?Your next appointment:   ?1 year(s) ? ?The format for your next appointment:   ?In Person ? ?Provider:   ?Allegra Lai, MD ? ? ?Thank you  for choosing [facility]!! ? ? ?Trinidad Curet, RN ?[phone] ? ?

## 2021-12-11 NOTE — Progress Notes (Signed)
? ?Electrophysiology Office Note ? ? ?Date:  12/11/2021  ? ?ID:  Gabriel Velazquez, DOB [birth year], MRN [id number] ? ?PCP:  Myrlene Broker, MD  ?Cardiologist:  Agustin Cree ?Primary Electrophysiologist:  Jim Lundin Meredith Leeds, MD   ? ?Chief Complaint: pacemaker ?  ?History of Present Illness: ?Gabriel Velazquez is a 66 y.o. male who is being seen today for the evaluation of pacemaker at the request of Myrlene Broker, MD. Presenting today for electrophysiology evaluation. ? ?He has a history significant for atrial fibrillation, CVA, hyperlipidemia, heart block status post Medtronic dual-chamber pacemaker, type 2 diabetes, hypertension.  He has had a chronically elevated RV threshold.  He presented to the emergency room 08/19/2021 with pectoralis stimulation.  Device was reprogrammed and set to bipolar and left pectoralis stimulation was resolved. ? ?Today, he denies symptoms of palpitations, chest pain, shortness of breath, orthopnea, PND, lower extremity edema, claudication, dizziness, presyncope, syncope, bleeding, or neurologic sequela. The patient is tolerating medications without difficulties.  ? ? ?Past Medical History:  ?Diagnosis Date  ? Atrial fibrillation ([place])   ? Bradycardia   ? Cerebrovascular accident (CVA) ([place])   ? Combined hyperlipidemia   ? Heart block   ? History of diverticulosis   ? Hypogonadism in male   ? Impotence of organic origin   ? Low back pain   ? Mixed dyslipidemia   ? Mobitz type 1 second degree atrioventricular block   ? Presence of permanent cardiac pacemaker 05/26/2020  ? Type 2 diabetes mellitus with hyperglycemia, without long-term current use of insulin ([place])   ? Uncontrolled hypertension   ? ?Past Surgical History:  ?Procedure Laterality Date  ? BACK SURGERY    ? colonscopy with polypectomy    ? PACEMAKER IMPLANT N/A 05/26/2020  ? Procedure: PACEMAKER IMPLANT;  Surgeon: Thompson Grayer, MD;  Location: [facility];  Service: Cardiovascular;  Laterality: N/A;  ? ? ? ?Current Outpatient  Medications  ?Medication Sig Dispense Refill  ? amLODipine (NORVASC) 10 MG tablet Take 10 mg by mouth daily.    ? apixaban (ELIQUIS) 5 MG TABS tablet Take 1 tablet (5 mg total) by mouth 2 (two) times daily. Resume Sunday with your MORNING dose. 60 tablet   ? atorvastatin (LIPITOR) 40 MG tablet Take 40 mg by mouth daily.     ? empagliflozin (JARDIANCE) 10 MG TABS tablet Take 10 mg by mouth daily.    ? hydrALAZINE (APRESOLINE) 10 MG tablet Take 1 tablet by mouth 2 (two) times daily.    ? hydrOXYzine (ATARAX/VISTARIL) 25 MG tablet Take 25 mg by mouth every 8 (eight) hours as needed for anxiety.    ? lisinopril-hydrochlorothiazide (ZESTORETIC) 20-12.5 MG tablet Take 1 tablet by mouth daily.    ? pantoprazole (PROTONIX) 40 MG tablet Take 1 tablet by mouth daily.    ? sitaGLIPtin (JANUVIA) 100 MG tablet Take 100 mg by mouth daily.    ? ?No current facility-administered medications for this visit.  ? ? ?Allergies:   Patient has no known allergies.  ? ?Social History:  The patient  reports that he has never smoked. He has never used smokeless tobacco. He reports that he does not drink alcohol and does not use drugs.  ? ?Family History:  The patient's family history includes Heart disease in his brother and father; Throat cancer in his mother.  ? ? ?ROS:  Please see the history of present illness.   Otherwise, review of systems is positive for none.   All other systems are reviewed  and negative.  ? ? ?PHYSICAL EXAM: ?VS:  BP 138/84   Pulse 78   Ht 5\' 9"  (1.753 m)   Wt 175 lb 12.8 oz (79.7 kg)   SpO2 98%   BMI 25.96 kg/m?  , BMI Body mass index is 25.96 kg/m?. ?GEN: Well nourished, well developed, in no acute distress  ?HEENT: normal  ?Neck: no JVD, carotid bruits, or masses ?Cardiac: RRR; no murmurs, rubs, or gallops,no edema  ?Respiratory:  clear to auscultation bilaterally, normal work of breathing ?GI: soft, nontender, nondistended, + BS ?MS: no deformity or atrophy  ?Skin: warm and dry, device pocket is well  healed ?Neuro:  Strength and sensation are intact ?Psych: euthymic mood, full affect ? ?EKG:  EKG is ordered today. ?Personal review of the ekg ordered  shows a sense, v pace ? ?Device interrogation is reviewed today in detail.  See PaceArt for details. ? ? ?Recent Labs: ?08/19/2021: BUN 30; Creatinine, Ser 1.69; Potassium 4.7; Sodium 133  ? ? ?Lipid Panel  ?No results found for: CHOL, TRIG, HDL, CHOLHDL, VLDL, LDLCALC, LDLDIRECT ? ? ?Wt Readings from Last 3 Encounters:  ?12/11/21 175 lb 12.8 oz (79.7 kg)  ?09/14/20 158 lb 12.8 oz (72 kg)  ?05/27/20 158 lb 12.8 oz (72 kg)  ?  ? ? ?Other studies Reviewed: ?Additional studies/ records that were reviewed today include: TTE 04/18/20  ?Review of the above records today demonstrates:  ?Ejection fraction 60 to 65% mild concentric LVH ?Impaired diastolic relaxation ? ? ?ASSESSMENT AND PLAN: ? ?1.  Complete heart block: Status post Medtronic dual-chamber pacemaker.  Device functioning appropriately.  No changes at this time. ? ?2.  Hypertension: Currently well controlled ? ?3.  Paroxysmal atrial fibrillation: Burden of 0% on device interrogation.  CHA2DS2-VASc of 4.  Currently on Eliquis 5 mg twice daily. ? ?4.  Secondary hypercoagulable state: Currently on Eliquis for atrial fibrillation as above ? ?Current medicines are reviewed at length with the patient today.   ?The patient does not have concerns regarding his medicines.  The following changes were made today:  none ? ?Labs/ tests ordered today include:  ?Orders Placed This Encounter  ?Procedures  ? EKG 12-Lead  ? ? ? ?Disposition:   FU with Ajanay Farve 1 year ? ?Signed, ?Shantelle Alles Meredith Leeds, MD  ?12/11/2021 11:45 AM    ? ?[facility] ?[address] ?[address] ?[place] [address] ?[phone] (office) ?[phone] (fax) ? ?

## 2021-12-12 NOTE — Progress Notes (Signed)
Remote pacemaker transmission.   

## 2022-02-23 ENCOUNTER — Ambulatory Visit (INDEPENDENT_AMBULATORY_CARE_PROVIDER_SITE_OTHER): Payer: No Typology Code available for payment source

## 2022-02-23 DIAGNOSIS — I442 Atrioventricular block, complete: Secondary | ICD-10-CM | POA: Diagnosis not present

## 2022-02-24 LAB — CUP PACEART REMOTE DEVICE CHECK
Battery Remaining Longevity: 57 mo
Battery Voltage: 2.97 V
Brady Statistic AP VP Percent: 14.96 %
Brady Statistic AP VS Percent: 0 %
Brady Statistic AS VP Percent: 84.89 %
Brady Statistic AS VS Percent: 0.16 %
Brady Statistic RA Percent Paced: 15.07 %
Brady Statistic RV Percent Paced: 99.84 %
Date Time Interrogation Session: 20230707142031
Implantable Lead Implant Date: 20211007
Implantable Lead Implant Date: 20211007
Implantable Lead Location: 753859
Implantable Lead Location: 753860
Implantable Lead Model: 3830
Implantable Lead Model: 5076
Implantable Pulse Generator Implant Date: 20211007
Lead Channel Impedance Value: 323 Ohm
Lead Channel Impedance Value: 361 Ohm
Lead Channel Impedance Value: 380 Ohm
Lead Channel Impedance Value: 475 Ohm
Lead Channel Pacing Threshold Amplitude: 0.5 V
Lead Channel Pacing Threshold Amplitude: 2.5 V
Lead Channel Pacing Threshold Pulse Width: 0.4 ms
Lead Channel Pacing Threshold Pulse Width: 0.4 ms
Lead Channel Sensing Intrinsic Amplitude: 11.25 mV
Lead Channel Sensing Intrinsic Amplitude: 4 mV
Lead Channel Sensing Intrinsic Amplitude: 4 mV
Lead Channel Sensing Intrinsic Amplitude: 9 mV
Lead Channel Setting Pacing Amplitude: 1.5 V
Lead Channel Setting Pacing Amplitude: 3.5 V
Lead Channel Setting Pacing Pulse Width: 1 ms
Lead Channel Setting Sensing Sensitivity: 2.8 mV

## 2022-03-12 NOTE — Progress Notes (Signed)
Remote pacemaker transmission.   

## 2022-05-25 ENCOUNTER — Ambulatory Visit (INDEPENDENT_AMBULATORY_CARE_PROVIDER_SITE_OTHER): Payer: No Typology Code available for payment source

## 2022-05-25 DIAGNOSIS — I442 Atrioventricular block, complete: Secondary | ICD-10-CM | POA: Diagnosis not present

## 2022-05-28 LAB — CUP PACEART REMOTE DEVICE CHECK
Battery Remaining Longevity: 53 mo
Battery Voltage: 2.97 V
Brady Statistic AP VP Percent: 24.45 %
Brady Statistic AP VS Percent: 0 %
Brady Statistic AS VP Percent: 75.34 %
Brady Statistic AS VS Percent: 0.21 %
Brady Statistic RA Percent Paced: 24.61 %
Brady Statistic RV Percent Paced: 99.79 %
Date Time Interrogation Session: 20231006143048
Implantable Lead Implant Date: 20211007
Implantable Lead Implant Date: 20211007
Implantable Lead Location: 753859
Implantable Lead Location: 753860
Implantable Lead Model: 3830
Implantable Lead Model: 5076
Implantable Pulse Generator Implant Date: 20211007
Lead Channel Impedance Value: 323 Ohm
Lead Channel Impedance Value: 342 Ohm
Lead Channel Impedance Value: 380 Ohm
Lead Channel Impedance Value: 456 Ohm
Lead Channel Pacing Threshold Amplitude: 0.5 V
Lead Channel Pacing Threshold Amplitude: 2.5 V
Lead Channel Pacing Threshold Pulse Width: 0.4 ms
Lead Channel Pacing Threshold Pulse Width: 0.4 ms
Lead Channel Sensing Intrinsic Amplitude: 10.375 mV
Lead Channel Sensing Intrinsic Amplitude: 10.375 mV
Lead Channel Sensing Intrinsic Amplitude: 3.625 mV
Lead Channel Sensing Intrinsic Amplitude: 3.625 mV
Lead Channel Setting Pacing Amplitude: 1.5 V
Lead Channel Setting Pacing Amplitude: 3.5 V
Lead Channel Setting Pacing Pulse Width: 1 ms
Lead Channel Setting Sensing Sensitivity: 2.8 mV

## 2022-05-29 NOTE — Progress Notes (Signed)
Remote pacemaker transmission.   

## 2022-06-20 IMAGING — DX DG CHEST 1V PORT
1 series · 1 of 1 positions shown · non-contrast
Comparison: May 27, 2020

CLINICAL DATA: Shortness of breath 4 days.

EXAM:
PORTABLE CHEST 1 VIEW

[chest]
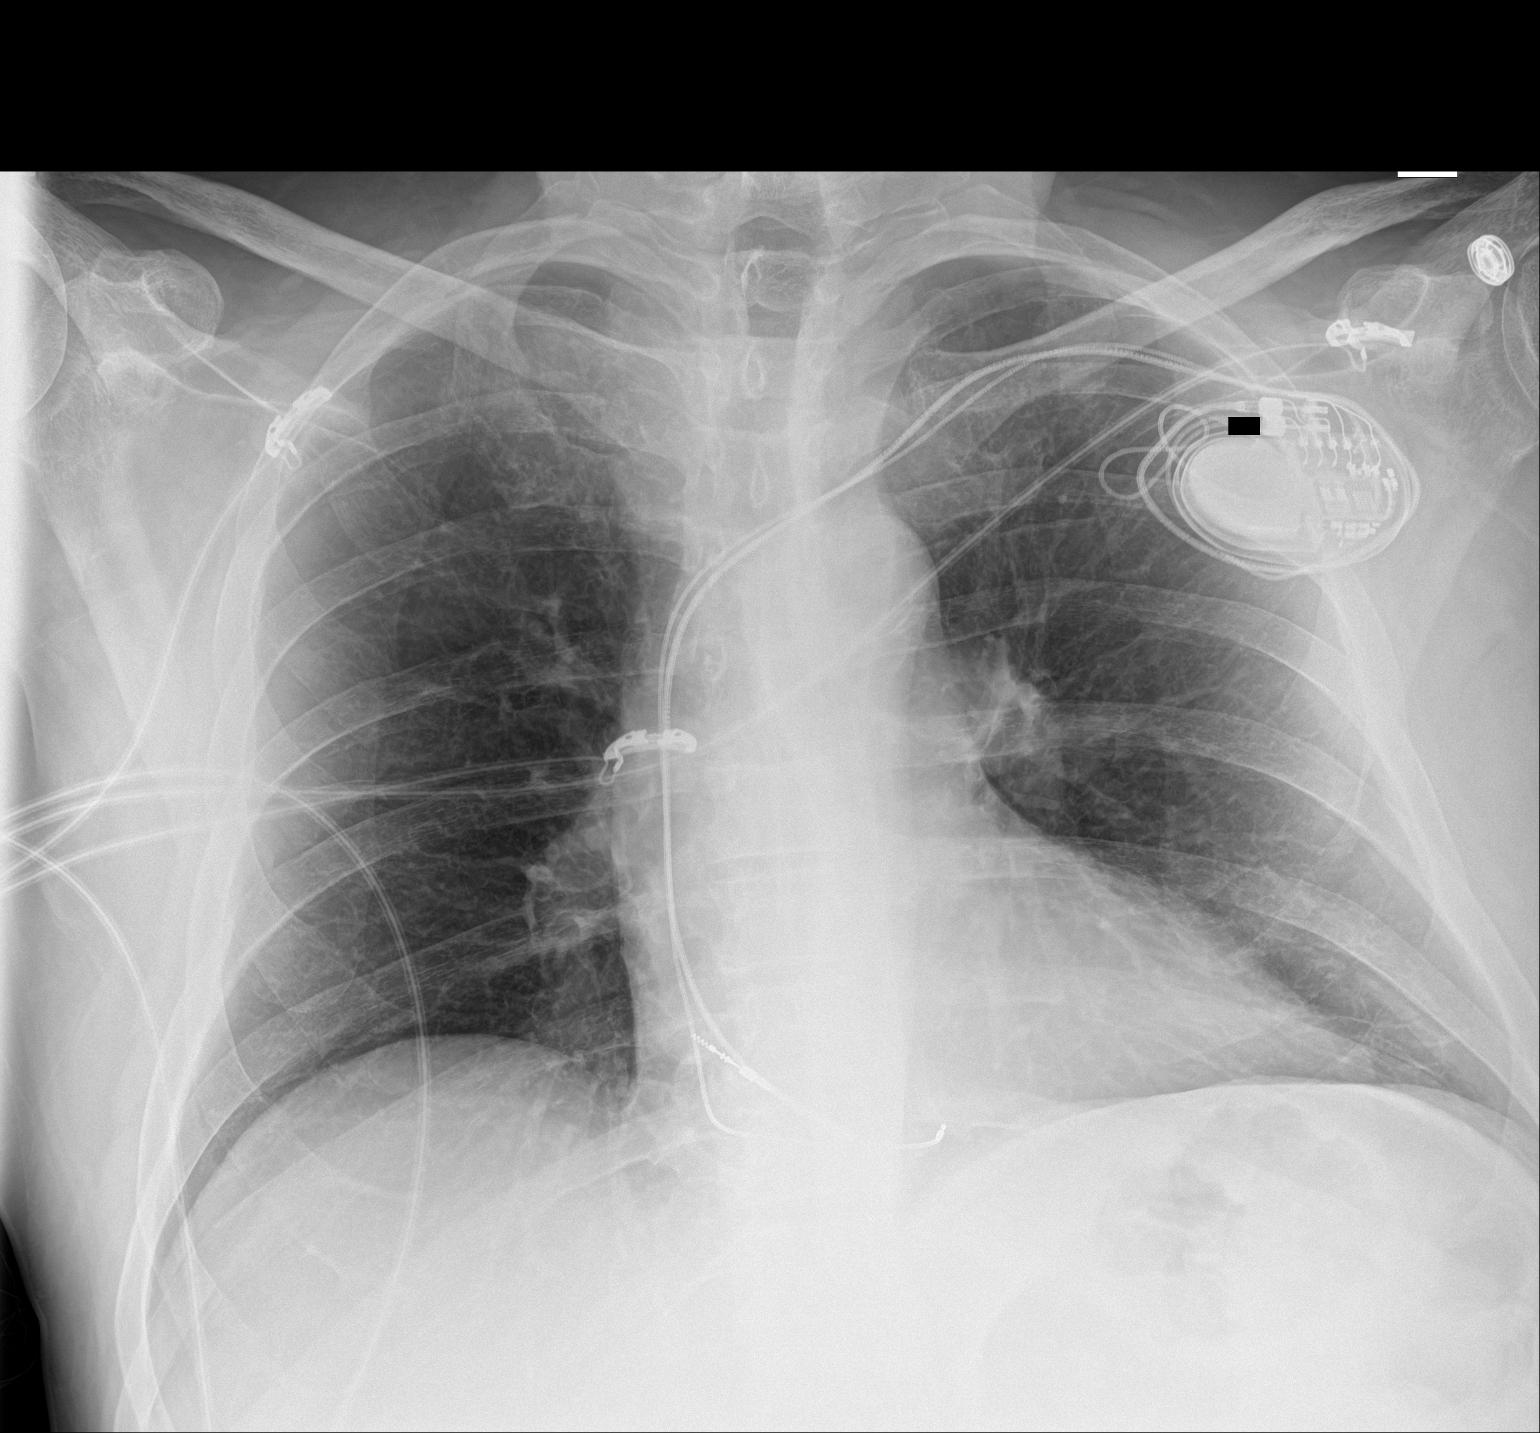

[1 of 1 positions shown; findings below may reference images not displayed]

FINDINGS: The heart size and mediastinal contours are stable. Cardiac
pacemaker is unchanged. Both lungs are clear. The visualized
skeletal structures are unremarkable.
IMPRESSION: No active disease.

## 2022-08-24 ENCOUNTER — Ambulatory Visit: Payer: No Typology Code available for payment source

## 2022-08-24 DIAGNOSIS — I442 Atrioventricular block, complete: Secondary | ICD-10-CM | POA: Diagnosis not present

## 2022-08-24 LAB — CUP PACEART REMOTE DEVICE CHECK
Battery Remaining Longevity: 50 mo
Battery Voltage: 2.96 V
Brady Statistic AP VP Percent: 21.85 %
Brady Statistic AP VS Percent: 0 %
Brady Statistic AS VP Percent: 77.77 %
Brady Statistic AS VS Percent: 0.38 %
Brady Statistic RA Percent Paced: 22.17 %
Brady Statistic RV Percent Paced: 99.62 %
Date Time Interrogation Session: 20240104211953
Implantable Lead Connection Status: 753985
Implantable Lead Connection Status: 753985
Implantable Lead Implant Date: 20211007
Implantable Lead Implant Date: 20211007
Implantable Lead Location: 753859
Implantable Lead Location: 753860
Implantable Lead Model: 3830
Implantable Lead Model: 5076
Implantable Pulse Generator Implant Date: 20211007
Lead Channel Impedance Value: 304 Ohm
Lead Channel Impedance Value: 342 Ohm
Lead Channel Impedance Value: 361 Ohm
Lead Channel Impedance Value: 456 Ohm
Lead Channel Pacing Threshold Amplitude: 0.5 V
Lead Channel Pacing Threshold Amplitude: 2.5 V
Lead Channel Pacing Threshold Pulse Width: 0.4 ms
Lead Channel Pacing Threshold Pulse Width: 0.4 ms
Lead Channel Sensing Intrinsic Amplitude: 10.375 mV
Lead Channel Sensing Intrinsic Amplitude: 10.375 mV
Lead Channel Sensing Intrinsic Amplitude: 3.625 mV
Lead Channel Sensing Intrinsic Amplitude: 3.625 mV
Lead Channel Setting Pacing Amplitude: 1.5 V
Lead Channel Setting Pacing Amplitude: 3.5 V
Lead Channel Setting Pacing Pulse Width: 1 ms
Lead Channel Setting Sensing Sensitivity: 2.8 mV
Zone Setting Status: 755011

## 2022-09-11 NOTE — Progress Notes (Signed)
Remote pacemaker transmission.   

## 2022-11-23 ENCOUNTER — Ambulatory Visit (INDEPENDENT_AMBULATORY_CARE_PROVIDER_SITE_OTHER): Payer: No Typology Code available for payment source

## 2022-11-23 DIAGNOSIS — I442 Atrioventricular block, complete: Secondary | ICD-10-CM

## 2022-11-23 LAB — CUP PACEART REMOTE DEVICE CHECK
Battery Remaining Longevity: 48 mo
Battery Voltage: 2.96 V
Brady Statistic AP VP Percent: 21.4 %
Brady Statistic AP VS Percent: 0 %
Brady Statistic AS VP Percent: 78.42 %
Brady Statistic AS VS Percent: 0.18 %
Brady Statistic RA Percent Paced: 21.55 %
Brady Statistic RV Percent Paced: 99.82 %
Date Time Interrogation Session: 20240405143441
Implantable Lead Connection Status: 753985
Implantable Lead Connection Status: 753985
Implantable Lead Implant Date: 20211007
Implantable Lead Implant Date: 20211007
Implantable Lead Location: 753859
Implantable Lead Location: 753860
Implantable Lead Model: 3830
Implantable Lead Model: 5076
Implantable Pulse Generator Implant Date: 20211007
Lead Channel Impedance Value: 304 Ohm
Lead Channel Impedance Value: 323 Ohm
Lead Channel Impedance Value: 361 Ohm
Lead Channel Impedance Value: 456 Ohm
Lead Channel Pacing Threshold Amplitude: 0.5 V
Lead Channel Pacing Threshold Amplitude: 2.5 V
Lead Channel Pacing Threshold Pulse Width: 0.4 ms
Lead Channel Pacing Threshold Pulse Width: 0.4 ms
Lead Channel Sensing Intrinsic Amplitude: 10.375 mV
Lead Channel Sensing Intrinsic Amplitude: 10.375 mV
Lead Channel Sensing Intrinsic Amplitude: 3.5 mV
Lead Channel Sensing Intrinsic Amplitude: 3.5 mV
Lead Channel Setting Pacing Amplitude: 1.5 V
Lead Channel Setting Pacing Amplitude: 3.5 V
Lead Channel Setting Pacing Pulse Width: 1 ms
Lead Channel Setting Sensing Sensitivity: 2.8 mV
Zone Setting Status: 755011

## 2022-12-25 NOTE — Progress Notes (Signed)
Remote pacemaker transmission.   

## 2023-01-17 DIAGNOSIS — I361 Nonrheumatic tricuspid (valve) insufficiency: Secondary | ICD-10-CM | POA: Diagnosis not present

## 2023-02-18 ENCOUNTER — Ambulatory Visit: Payer: PPO | Attending: Cardiology | Admitting: Cardiology

## 2023-02-18 ENCOUNTER — Encounter: Payer: Self-pay | Admitting: Cardiology

## 2023-02-18 VITALS — BP 136/80 | HR 75 | Ht 69.0 in | Wt 171.8 lb

## 2023-02-18 DIAGNOSIS — Z79899 Other long term (current) drug therapy: Secondary | ICD-10-CM | POA: Insufficient documentation

## 2023-02-18 DIAGNOSIS — I480 Paroxysmal atrial fibrillation: Secondary | ICD-10-CM | POA: Diagnosis not present

## 2023-02-18 DIAGNOSIS — I442 Atrioventricular block, complete: Secondary | ICD-10-CM | POA: Insufficient documentation

## 2023-02-18 DIAGNOSIS — D6869 Other thrombophilia: Secondary | ICD-10-CM | POA: Insufficient documentation

## 2023-02-18 LAB — CUP PACEART INCLINIC DEVICE CHECK
Battery Remaining Longevity: 37 mo
Battery Voltage: 2.93 V
Brady Statistic AP VP Percent: 48.53 %
Brady Statistic AP VS Percent: 0 %
Brady Statistic AS VP Percent: 51.36 %
Brady Statistic AS VS Percent: 0.11 %
Brady Statistic RA Percent Paced: 48.62 %
Brady Statistic RV Percent Paced: 99.89 %
Date Time Interrogation Session: 20240701124437
Implantable Lead Connection Status: 753985
Implantable Lead Connection Status: 753985
Implantable Lead Implant Date: 20211007
Implantable Lead Implant Date: 20211007
Implantable Lead Location: 753859
Implantable Lead Location: 753860
Implantable Lead Model: 3830
Implantable Lead Model: 5076
Implantable Pulse Generator Implant Date: 20211007
Lead Channel Impedance Value: 323 Ohm
Lead Channel Impedance Value: 342 Ohm
Lead Channel Impedance Value: 456 Ohm
Lead Channel Impedance Value: 513 Ohm
Lead Channel Pacing Threshold Amplitude: 0.75 V
Lead Channel Pacing Threshold Amplitude: 2.25 V
Lead Channel Pacing Threshold Amplitude: 2.5 V
Lead Channel Pacing Threshold Pulse Width: 0.4 ms
Lead Channel Pacing Threshold Pulse Width: 0.4 ms
Lead Channel Pacing Threshold Pulse Width: 1 ms
Lead Channel Sensing Intrinsic Amplitude: 10.375 mV
Lead Channel Sensing Intrinsic Amplitude: 10.5 mV
Lead Channel Sensing Intrinsic Amplitude: 3.25 mV
Lead Channel Sensing Intrinsic Amplitude: 3.75 mV
Lead Channel Setting Pacing Amplitude: 1.5 V
Lead Channel Setting Pacing Amplitude: 3.5 V
Lead Channel Setting Pacing Pulse Width: 1 ms
Lead Channel Setting Sensing Sensitivity: 4 mV
Zone Setting Status: 755011

## 2023-02-18 NOTE — Progress Notes (Signed)
  Electrophysiology Office Note:   Date:  02/18/2023  ID:  Gabriel Velazquez, DOB 01-Sep-1955, MRN [id number]  Primary Cardiologist: None Electrophysiologist: Grisel Blumenstock Jorja Loa, MD      History of Present Illness:   Gabriel Velazquez is a 67 y.o. male with h/o complete heart block seen today for routine electrophysiology followup.  Since last being seen in our clinic the patient reports doing well.  He has no chest pain or shortness of breath.  He is able to do all of his daily activities without restriction.  He was recently started on insulin for diabetes.  Aside from that he has no acute complaints..  he denies chest pain, palpitations, dyspnea, PND, orthopnea, nausea, vomiting, dizziness, syncope, edema, weight gain, or early satiety.    He has a history significant for atrial fibrillation, CVA, hyperlipidemia, heart block post Medtronic dual-chamber pacemaker, diabetes, hypertension.  He has a chronically elevated RV threshold.  He presented to emergency room 08/19/2021 with PAC stimulation.  Device was programmed and set to bipolar and left pec stimulation resolved.   Review of systems complete and found to be negative unless listed in HPI.      EP information / Studies Reviewed:    EKG is ordered today. Personal review as below. A sense, V pace     PPM Interrogation-  reviewed in detail today,  See PACEART report.  Device History: Medtronic Dual Chamber PPM implanted 05/26/2020 for CHB  Risk Assessment/Calculations:    CHA2DS2-VASc Score = 4   This indicates a 4.8% annual risk of stroke. The patient's score is based upon: CHF History: 0 HTN History: 0 Diabetes History: 1 Stroke History: 2 Vascular Disease History: 0 Age Score: 1 Gender Score: 0             Physical Exam:   VS:  BP 136/80   Pulse 75   Ht 5\' 9"  (1.753 m)   Wt 171 lb 12.8 oz (77.9 kg)   SpO2 99%   BMI 25.37 kg/m    Wt Readings from Last 3 Encounters:  02/18/23 171 lb 12.8 oz (77.9 kg)  12/11/21 175 lb 12.8  oz (79.7 kg)  09/14/20 158 lb 12.8 oz (72 kg)     GEN: Well nourished, well developed in no acute distress NECK: No JVD; No carotid bruits CARDIAC: Regular rate and rhythm, no murmurs, rubs, gallops RESPIRATORY:  Clear to auscultation without rales, wheezing or rhonchi  ABDOMEN: Soft, non-tender, non-distended EXTREMITIES:  No edema; No deformity   ASSESSMENT AND PLAN:    1.  CHB s/p Medtronic PPM  Normal PPM function See Pace Art report No changes today  2.  Hypertension: Currently well-controlled  3.  Paroxysmal atrial fibrillation: Burden of 0% noted on pacemaker.  Currently on Eliquis.   4.  Secondary hypercoagulable state: Currently on Eliquis for atrial fibrillation   Disposition:   Follow up with Dr. Elberta Fortis in 12 months  Signed, Kayleb Warshaw Jorja Loa, MD

## 2023-02-18 NOTE — Patient Instructions (Addendum)
Medication Instructions:  Your physician recommends that you continue on your current medications as directed. Please refer to the Current Medication list given to you today.  *If you need a refill on your cardiac medications before your next appointment, please call your pharmacy*   Lab Work: Eliquis surveillance labs today: BMET  &CBC If you have labs (blood work) drawn today and your tests are completely normal, you will receive your results only by: MyChart Message (if you have MyChart) OR A paper copy in the mail If you have any lab test that is abnormal or we need to change your treatment, we will call you to review the results.   Testing/Procedures: None ordered   Follow-Up: At [facility], you and your health needs are our priority.  As part of our continuing mission to provide you with exceptional heart care, we have created designated Provider Care Teams.  These Care Teams include your primary Cardiologist (physician) and Advanced Practice Providers (APPs -  Physician Assistants and Nurse Practitioners) who all work together to provide you with the care you need, when you need it.  We recommend signing up for the patient portal called "MyChart".  Sign up information is provided on this After Visit Summary.  MyChart is used to connect with patients for Virtual Visits (Telemedicine).  Patients are able to view lab/test results, encounter notes, upcoming appointments, etc.  Non-urgent messages can be sent to your provider as well.   To learn more about what you can do with MyChart, go to [email or web address].    Remote monitoring is used to monitor your Pacemaker or ICD from home. This monitoring reduces the number of office visits required to check your device to one time per year. It allows [place] to keep an eye on the functioning of your device to ensure it is working properly. You are scheduled for a device check from home on 02/22/2023. You may send your transmission at any time  that day. If you have a wireless device, the transmission will be sent automatically. After your physician reviews your transmission, you will receive a postcard with your next transmission date.  Your next appointment:   1 year(s)  The format for your next appointment:   In Person  Provider:   Loman Brooklyn, MD    Thank you for choosing [facility]!!   Dory Horn, RN [phone]    Other Instructions

## 2023-02-19 LAB — CBC
Hematocrit: 45.2 % (ref 37.5–51.0)
Hemoglobin: 14.4 g/dL (ref 13.0–17.7)
MCH: 27.5 pg (ref 26.6–33.0)
MCHC: 31.9 g/dL (ref 31.5–35.7)
MCV: 86 fL (ref 79–97)
Platelets: 265 10*3/uL (ref 150–450)
RBC: 5.23 x10E6/uL (ref 4.14–5.80)
RDW: 13.1 % (ref 11.6–15.4)
WBC: 7 10*3/uL (ref 3.4–10.8)

## 2023-02-19 LAB — BASIC METABOLIC PANEL
BUN/Creatinine Ratio: 15 (ref 10–24)
BUN: 24 mg/dL (ref 8–27)
CO2: 22 mmol/L (ref 20–29)
Calcium: 9.3 mg/dL (ref 8.6–10.2)
Chloride: 106 mmol/L (ref 96–106)
Creatinine, Ser: 1.56 mg/dL — ABNORMAL HIGH (ref 0.76–1.27)
Glucose: 106 mg/dL — ABNORMAL HIGH (ref 70–99)
Potassium: 4.1 mmol/L (ref 3.5–5.2)
Sodium: 142 mmol/L (ref 134–144)
eGFR: 48 mL/min/{1.73_m2} — ABNORMAL LOW (ref >59)

## 2023-02-22 ENCOUNTER — Ambulatory Visit (INDEPENDENT_AMBULATORY_CARE_PROVIDER_SITE_OTHER): Payer: No Typology Code available for payment source

## 2023-02-22 DIAGNOSIS — I442 Atrioventricular block, complete: Secondary | ICD-10-CM | POA: Diagnosis not present

## 2023-02-23 LAB — CUP PACEART REMOTE DEVICE CHECK
Battery Remaining Longevity: 36 mo
Battery Voltage: 2.94 V
Brady Statistic AP VP Percent: 39.27 %
Brady Statistic AP VS Percent: 0 %
Brady Statistic AS VP Percent: 60.49 %
Brady Statistic AS VS Percent: 0.25 %
Brady Statistic RA Percent Paced: 39.49 %
Brady Statistic RV Percent Paced: 99.75 %
Date Time Interrogation Session: 20240704220846
Implantable Lead Connection Status: 753985
Implantable Lead Connection Status: 753985
Implantable Lead Implant Date: 20211007
Implantable Lead Implant Date: 20211007
Implantable Lead Location: 753859
Implantable Lead Location: 753860
Implantable Lead Model: 3830
Implantable Lead Model: 5076
Implantable Pulse Generator Implant Date: 20211007
Lead Channel Impedance Value: 304 Ohm
Lead Channel Impedance Value: 323 Ohm
Lead Channel Impedance Value: 437 Ohm
Lead Channel Impedance Value: 437 Ohm
Lead Channel Pacing Threshold Amplitude: 0.75 V
Lead Channel Pacing Threshold Amplitude: 2.5 V
Lead Channel Pacing Threshold Pulse Width: 0.4 ms
Lead Channel Pacing Threshold Pulse Width: 0.4 ms
Lead Channel Sensing Intrinsic Amplitude: 10.375 mV
Lead Channel Sensing Intrinsic Amplitude: 10.5 mV
Lead Channel Sensing Intrinsic Amplitude: 2.75 mV
Lead Channel Sensing Intrinsic Amplitude: 2.75 mV
Lead Channel Setting Pacing Amplitude: 1.5 V
Lead Channel Setting Pacing Amplitude: 3.5 V
Lead Channel Setting Pacing Pulse Width: 1 ms
Lead Channel Setting Sensing Sensitivity: 4 mV
Zone Setting Status: 755011

## 2023-03-11 NOTE — Progress Notes (Signed)
Remote pacemaker transmission.   

## 2023-05-24 ENCOUNTER — Ambulatory Visit (INDEPENDENT_AMBULATORY_CARE_PROVIDER_SITE_OTHER): Payer: No Typology Code available for payment source

## 2023-05-24 DIAGNOSIS — I442 Atrioventricular block, complete: Secondary | ICD-10-CM | POA: Diagnosis not present

## 2023-05-24 LAB — CUP PACEART REMOTE DEVICE CHECK
Battery Remaining Longevity: 36 mo
Battery Voltage: 2.94 V
Brady Statistic AP VP Percent: 41.07 %
Brady Statistic AP VS Percent: 0 %
Brady Statistic AS VP Percent: 58.74 %
Brady Statistic AS VS Percent: 0.18 %
Brady Statistic RA Percent Paced: 41.24 %
Brady Statistic RV Percent Paced: 99.82 %
Date Time Interrogation Session: 20241004001623
Implantable Lead Connection Status: 753985
Implantable Lead Connection Status: 753985
Implantable Lead Implant Date: 20211007
Implantable Lead Implant Date: 20211007
Implantable Lead Location: 753859
Implantable Lead Location: 753860
Implantable Lead Model: 3830
Implantable Lead Model: 5076
Implantable Pulse Generator Implant Date: 20211007
Lead Channel Impedance Value: 304 Ohm
Lead Channel Impedance Value: 323 Ohm
Lead Channel Impedance Value: 399 Ohm
Lead Channel Impedance Value: 456 Ohm
Lead Channel Pacing Threshold Amplitude: 0.625 V
Lead Channel Pacing Threshold Amplitude: 2.5 V
Lead Channel Pacing Threshold Pulse Width: 0.4 ms
Lead Channel Pacing Threshold Pulse Width: 0.4 ms
Lead Channel Sensing Intrinsic Amplitude: 10.375 mV
Lead Channel Sensing Intrinsic Amplitude: 10.5 mV
Lead Channel Sensing Intrinsic Amplitude: 3.375 mV
Lead Channel Sensing Intrinsic Amplitude: 3.375 mV
Lead Channel Setting Pacing Amplitude: 1.5 V
Lead Channel Setting Pacing Amplitude: 3.5 V
Lead Channel Setting Pacing Pulse Width: 1 ms
Lead Channel Setting Sensing Sensitivity: 4 mV
Zone Setting Status: 755011

## 2023-06-05 NOTE — Progress Notes (Signed)
Remote pacemaker transmission.   

## 2023-08-23 ENCOUNTER — Ambulatory Visit (INDEPENDENT_AMBULATORY_CARE_PROVIDER_SITE_OTHER): Payer: No Typology Code available for payment source

## 2023-08-23 DIAGNOSIS — I442 Atrioventricular block, complete: Secondary | ICD-10-CM | POA: Diagnosis not present

## 2023-08-23 LAB — CUP PACEART REMOTE DEVICE CHECK
Battery Remaining Longevity: 31 mo
Battery Voltage: 2.93 V
Brady Statistic AP VP Percent: 37.6 %
Brady Statistic AP VS Percent: 0 %
Brady Statistic AS VP Percent: 62.2 %
Brady Statistic AS VS Percent: 0.2 %
Brady Statistic RA Percent Paced: 37.76 %
Brady Statistic RV Percent Paced: 99.8 %
Date Time Interrogation Session: 20250102204542
Implantable Lead Connection Status: 753985
Implantable Lead Connection Status: 753985
Implantable Lead Implant Date: 20211007
Implantable Lead Implant Date: 20211007
Implantable Lead Location: 753859
Implantable Lead Location: 753860
Implantable Lead Model: 3830
Implantable Lead Model: 5076
Implantable Pulse Generator Implant Date: 20211007
Lead Channel Impedance Value: 304 Ohm
Lead Channel Impedance Value: 342 Ohm
Lead Channel Impedance Value: 361 Ohm
Lead Channel Impedance Value: 456 Ohm
Lead Channel Pacing Threshold Amplitude: 0.625 V
Lead Channel Pacing Threshold Amplitude: 2.5 V
Lead Channel Pacing Threshold Pulse Width: 0.4 ms
Lead Channel Pacing Threshold Pulse Width: 0.4 ms
Lead Channel Sensing Intrinsic Amplitude: 10.375 mV
Lead Channel Sensing Intrinsic Amplitude: 10.5 mV
Lead Channel Sensing Intrinsic Amplitude: 3.25 mV
Lead Channel Sensing Intrinsic Amplitude: 3.25 mV
Lead Channel Setting Pacing Amplitude: 1.5 V
Lead Channel Setting Pacing Amplitude: 3.5 V
Lead Channel Setting Pacing Pulse Width: 1 ms
Lead Channel Setting Sensing Sensitivity: 4 mV
Zone Setting Status: 755011

## 2023-10-01 NOTE — Progress Notes (Signed)
Remote pacemaker transmission.

## 2023-11-25 ENCOUNTER — Ambulatory Visit (INDEPENDENT_AMBULATORY_CARE_PROVIDER_SITE_OTHER): Payer: No Typology Code available for payment source

## 2023-11-25 DIAGNOSIS — I442 Atrioventricular block, complete: Secondary | ICD-10-CM

## 2023-11-25 LAB — CUP PACEART REMOTE DEVICE CHECK
Battery Remaining Longevity: 24 mo
Battery Voltage: 2.92 V
Brady Statistic AP VP Percent: 34.19 %
Brady Statistic AP VS Percent: 0 %
Brady Statistic AS VP Percent: 65.63 %
Brady Statistic AS VS Percent: 0.18 %
Brady Statistic RA Percent Paced: 34.34 %
Brady Statistic RV Percent Paced: 99.82 %
Date Time Interrogation Session: 20250407065531
Implantable Lead Connection Status: 753985
Implantable Lead Connection Status: 753985
Implantable Lead Implant Date: 20211007
Implantable Lead Implant Date: 20211007
Implantable Lead Location: 753859
Implantable Lead Location: 753860
Implantable Lead Model: 3830
Implantable Lead Model: 5076
Implantable Pulse Generator Implant Date: 20211007
Lead Channel Impedance Value: 304 Ohm
Lead Channel Impedance Value: 342 Ohm
Lead Channel Impedance Value: 342 Ohm
Lead Channel Impedance Value: 456 Ohm
Lead Channel Pacing Threshold Amplitude: 0.625 V
Lead Channel Pacing Threshold Amplitude: 2.5 V
Lead Channel Pacing Threshold Pulse Width: 0.4 ms
Lead Channel Pacing Threshold Pulse Width: 0.4 ms
Lead Channel Sensing Intrinsic Amplitude: 3.375 mV
Lead Channel Sensing Intrinsic Amplitude: 3.375 mV
Lead Channel Sensing Intrinsic Amplitude: 9.625 mV
Lead Channel Sensing Intrinsic Amplitude: 9.625 mV
Lead Channel Setting Pacing Amplitude: 1.5 V
Lead Channel Setting Pacing Amplitude: 3.5 V
Lead Channel Setting Pacing Pulse Width: 1 ms
Lead Channel Setting Sensing Sensitivity: 4 mV
Zone Setting Status: 755011

## 2023-12-16 LAB — COLOGUARD: COLOGUARD: NEGATIVE

## 2024-01-07 NOTE — Progress Notes (Signed)
 Remote pacemaker transmission.

## 2024-02-11 ENCOUNTER — Encounter: Payer: Self-pay | Admitting: Cardiology

## 2024-02-11 ENCOUNTER — Ambulatory Visit: Attending: Cardiology | Admitting: Cardiology

## 2024-02-11 VITALS — BP 142/80 | HR 81 | Ht 69.0 in | Wt 164.0 lb

## 2024-02-11 DIAGNOSIS — I480 Paroxysmal atrial fibrillation: Secondary | ICD-10-CM

## 2024-02-11 DIAGNOSIS — I442 Atrioventricular block, complete: Secondary | ICD-10-CM

## 2024-02-11 DIAGNOSIS — D6869 Other thrombophilia: Secondary | ICD-10-CM

## 2024-02-11 NOTE — Progress Notes (Signed)
  Electrophysiology Office Note:   Date:  02/11/2024  ID:  Gabriel Velazquez, DOB July 28, 1956, MRN [id number]  Primary Cardiologist: None Primary Heart Failure: None Electrophysiologist: Damoni Erker Gladis Norton, MD      History of Present Illness:   Gabriel Velazquez is a 68 y.o. male with h/o complete heart block, atrial fibrillation, CVA, hyperlipidemia, diabetes, hypertension seen today for routine electrophysiology followup.   Since last being seen in our clinic the patient reports doing overall well.  He gets short of breath when he exerts himself, but this only occurs outside.  He attributes this to the heat.  He has no other acute complaints.  He is able to do his daily activities without restriction.  he denies chest pain, palpitations, dyspnea, PND, orthopnea, nausea, vomiting, dizziness, syncope, edema, weight gain, or early satiety.   Review of systems complete and found to be negative unless listed in HPI.      EP Information / Studies Reviewed:    EKG is ordered today. Personal review as below.      PPM Interrogation-  reviewed in detail today,  See PACEART report.  Device History: Medtronic Dual Chamber PPM implanted 05/26/2020 for CHB  Risk Assessment/Calculations:    CHA2DS2-VASc Score = 4   This indicates a 4.8% annual risk of stroke. The patient's score is based upon: CHF History: 0 HTN History: 0 Diabetes History: 1 Stroke History: 2 Vascular Disease History: 0 Age Score: 1 Gender Score: 0            Physical Exam:   VS:  There were no vitals taken for this visit.   Wt Readings from Last 3 Encounters:  02/18/23 171 lb 12.8 oz (77.9 kg)  12/11/21 175 lb 12.8 oz (79.7 kg)  09/14/20 158 lb 12.8 oz (72 kg)     GEN: Well nourished, well developed in no acute distress NECK: No JVD; No carotid bruits CARDIAC: Regular rate and rhythm, no murmurs, rubs, gallops RESPIRATORY:  Clear to auscultation without rales, wheezing or rhonchi  ABDOMEN: Soft, non-tender,  non-distended EXTREMITIES:  No edema; No deformity   ASSESSMENT AND PLAN:    CHB s/p Medtronic PPM  Normal PPM function Sensing, threshold, impedance within normal limits Programming appropriate See Pace Art report No changes today  2.  Paroxysmal atrial fibrillation: Minimal noted on device interrogation  3.  Secondary hypercoagulable state: On Eliquis  for atrial fibrillation  4.  Hypertension: Mildly elevated today but usually well-controlled  Disposition:   Follow up with Dr. Norton in 12 months  Signed, Prabhjot Maddux Gladis Norton, MD

## 2024-02-24 ENCOUNTER — Ambulatory Visit (INDEPENDENT_AMBULATORY_CARE_PROVIDER_SITE_OTHER): Payer: No Typology Code available for payment source

## 2024-02-24 DIAGNOSIS — I442 Atrioventricular block, complete: Secondary | ICD-10-CM

## 2024-02-25 LAB — CUP PACEART REMOTE DEVICE CHECK
Battery Remaining Longevity: 18 mo
Battery Voltage: 2.91 V
Brady Statistic AP VP Percent: 44.29 %
Brady Statistic AP VS Percent: 0 %
Brady Statistic AS VP Percent: 55.71 %
Brady Statistic AS VS Percent: 0 %
Brady Statistic RA Percent Paced: 44.27 %
Brady Statistic RV Percent Paced: 100 %
Date Time Interrogation Session: 20250707014608
Implantable Lead Connection Status: 753985
Implantable Lead Connection Status: 753985
Implantable Lead Implant Date: 20211007
Implantable Lead Implant Date: 20211007
Implantable Lead Location: 753859
Implantable Lead Location: 753860
Implantable Lead Model: 3830
Implantable Lead Model: 5076
Implantable Pulse Generator Implant Date: 20211007
Lead Channel Impedance Value: 304 Ohm
Lead Channel Impedance Value: 342 Ohm
Lead Channel Impedance Value: 342 Ohm
Lead Channel Impedance Value: 456 Ohm
Lead Channel Pacing Threshold Amplitude: 0.625 V
Lead Channel Pacing Threshold Amplitude: 2.5 V
Lead Channel Pacing Threshold Pulse Width: 0.4 ms
Lead Channel Pacing Threshold Pulse Width: 0.4 ms
Lead Channel Sensing Intrinsic Amplitude: 11.625 mV
Lead Channel Sensing Intrinsic Amplitude: 3.625 mV
Lead Channel Sensing Intrinsic Amplitude: 3.625 mV
Lead Channel Sensing Intrinsic Amplitude: 9.625 mV
Lead Channel Setting Pacing Amplitude: 1.5 V
Lead Channel Setting Pacing Amplitude: 3.5 V
Lead Channel Setting Pacing Pulse Width: 1 ms
Lead Channel Setting Sensing Sensitivity: 4 mV
Zone Setting Status: 755011

## 2024-03-21 ENCOUNTER — Ambulatory Visit: Payer: Self-pay | Admitting: Cardiology

## 2024-05-25 ENCOUNTER — Ambulatory Visit (INDEPENDENT_AMBULATORY_CARE_PROVIDER_SITE_OTHER): Payer: No Typology Code available for payment source

## 2024-05-25 DIAGNOSIS — I442 Atrioventricular block, complete: Secondary | ICD-10-CM | POA: Diagnosis not present

## 2024-05-27 LAB — CUP PACEART REMOTE DEVICE CHECK
Battery Remaining Longevity: 18 mo
Battery Voltage: 2.9 V
Brady Statistic AP VP Percent: 48.2 %
Brady Statistic AP VS Percent: 0 %
Brady Statistic AS VP Percent: 51.8 %
Brady Statistic AS VS Percent: 0.01 %
Brady Statistic RA Percent Paced: 48.19 %
Brady Statistic RV Percent Paced: 99.99 %
Date Time Interrogation Session: 20251005212105
Implantable Lead Connection Status: 753985
Implantable Lead Connection Status: 753985
Implantable Lead Implant Date: 20211007
Implantable Lead Implant Date: 20211007
Implantable Lead Location: 753859
Implantable Lead Location: 753860
Implantable Lead Model: 3830
Implantable Lead Model: 5076
Implantable Pulse Generator Implant Date: 20211007
Lead Channel Impedance Value: 285 Ohm
Lead Channel Impedance Value: 361 Ohm
Lead Channel Impedance Value: 399 Ohm
Lead Channel Impedance Value: 513 Ohm
Lead Channel Pacing Threshold Amplitude: 0.625 V
Lead Channel Pacing Threshold Amplitude: 2.5 V
Lead Channel Pacing Threshold Pulse Width: 0.4 ms
Lead Channel Pacing Threshold Pulse Width: 0.4 ms
Lead Channel Sensing Intrinsic Amplitude: 11.625 mV
Lead Channel Sensing Intrinsic Amplitude: 2.75 mV
Lead Channel Sensing Intrinsic Amplitude: 2.75 mV
Lead Channel Sensing Intrinsic Amplitude: 9.625 mV
Lead Channel Setting Pacing Amplitude: 1.5 V
Lead Channel Setting Pacing Amplitude: 3.5 V
Lead Channel Setting Pacing Pulse Width: 1 ms
Lead Channel Setting Sensing Sensitivity: 4 mV
Zone Setting Status: 755011

## 2024-05-28 NOTE — Progress Notes (Signed)
 Remote PPM Transmission

## 2024-05-29 ENCOUNTER — Ambulatory Visit: Payer: Self-pay | Admitting: Cardiology

## 2024-06-01 NOTE — Progress Notes (Signed)
 Remote PPM Transmission

## 2024-08-05 DIAGNOSIS — R079 Chest pain, unspecified: Secondary | ICD-10-CM | POA: Diagnosis not present

## 2024-08-24 ENCOUNTER — Ambulatory Visit: Payer: No Typology Code available for payment source

## 2024-08-24 DIAGNOSIS — I442 Atrioventricular block, complete: Secondary | ICD-10-CM

## 2024-08-26 ENCOUNTER — Ambulatory Visit: Payer: Self-pay | Admitting: Cardiology

## 2024-08-26 LAB — CUP PACEART REMOTE DEVICE CHECK
Battery Remaining Longevity: 19 mo
Battery Voltage: 2.89 V
Brady Statistic AP VP Percent: 40.63 %
Brady Statistic AP VS Percent: 0 %
Brady Statistic AS VP Percent: 59.36 %
Brady Statistic AS VS Percent: 0.01 %
Brady Statistic RA Percent Paced: 40.62 %
Brady Statistic RV Percent Paced: 99.99 %
Date Time Interrogation Session: 20260105025027
Implantable Lead Connection Status: 753985
Implantable Lead Connection Status: 753985
Implantable Lead Implant Date: 20211007
Implantable Lead Implant Date: 20211007
Implantable Lead Location: 753859
Implantable Lead Location: 753860
Implantable Lead Model: 3830
Implantable Lead Model: 5076
Implantable Pulse Generator Implant Date: 20211007
Lead Channel Impedance Value: 304 Ohm
Lead Channel Impedance Value: 342 Ohm
Lead Channel Impedance Value: 399 Ohm
Lead Channel Impedance Value: 513 Ohm
Lead Channel Pacing Threshold Amplitude: 0.625 V
Lead Channel Pacing Threshold Amplitude: 2.5 V
Lead Channel Pacing Threshold Pulse Width: 0.4 ms
Lead Channel Pacing Threshold Pulse Width: 0.4 ms
Lead Channel Sensing Intrinsic Amplitude: 11.625 mV
Lead Channel Sensing Intrinsic Amplitude: 3.75 mV
Lead Channel Sensing Intrinsic Amplitude: 3.75 mV
Lead Channel Sensing Intrinsic Amplitude: 9.625 mV
Lead Channel Setting Pacing Amplitude: 1.5 V
Lead Channel Setting Pacing Amplitude: 3.5 V
Lead Channel Setting Pacing Pulse Width: 1 ms
Lead Channel Setting Sensing Sensitivity: 4 mV
Zone Setting Status: 755011

## 2024-08-27 NOTE — Progress Notes (Signed)
 Remote PPM Transmission

## 2025-02-22 ENCOUNTER — Encounter

## 2025-05-24 ENCOUNTER — Encounter

## 2025-08-23 ENCOUNTER — Encounter

## 2025-11-22 ENCOUNTER — Encounter
